# Patient Record
Sex: Female | Born: 1975 | Hispanic: No | Smoking: Current every day smoker
Health system: Southern US, Community
[De-identification: ages and names within clinical notes are randomized; demographics above are authoritative.]

## PROBLEM LIST (undated history)

## (undated) DIAGNOSIS — IMO0002 Reserved for concepts with insufficient information to code with codable children: Secondary | ICD-10-CM

## (undated) DIAGNOSIS — I8390 Asymptomatic varicose veins of unspecified lower extremity: Secondary | ICD-10-CM

## (undated) DIAGNOSIS — E079 Disorder of thyroid, unspecified: Secondary | ICD-10-CM

## (undated) DIAGNOSIS — C801 Malignant (primary) neoplasm, unspecified: Secondary | ICD-10-CM

## (undated) DIAGNOSIS — R87619 Unspecified abnormal cytological findings in specimens from cervix uteri: Secondary | ICD-10-CM

## (undated) DIAGNOSIS — G43909 Migraine, unspecified, not intractable, without status migrainosus: Secondary | ICD-10-CM

## (undated) HISTORY — DX: Asymptomatic varicose veins of unspecified lower extremity: I83.90

## (undated) HISTORY — DX: Migraine, unspecified, not intractable, without status migrainosus: G43.909

## (undated) HISTORY — DX: Reserved for concepts with insufficient information to code with codable children: IMO0002

## (undated) HISTORY — DX: Malignant (primary) neoplasm, unspecified: C80.1

## (undated) HISTORY — PX: COLPOSCOPY: SHX161

## (undated) HISTORY — DX: Unspecified abnormal cytological findings in specimens from cervix uteri: R87.619

## (undated) HISTORY — DX: Disorder of thyroid, unspecified: E07.9

---

## 2005-09-28 ENCOUNTER — Other Ambulatory Visit: Admission: RE | Admit: 2005-09-28 | Discharge: 2005-09-28 | Payer: Self-pay | Admitting: Obstetrics & Gynecology

## 2005-10-29 ENCOUNTER — Other Ambulatory Visit: Admission: RE | Admit: 2005-10-29 | Discharge: 2005-10-29 | Payer: Self-pay | Admitting: Obstetrics & Gynecology

## 2006-01-22 DIAGNOSIS — C801 Malignant (primary) neoplasm, unspecified: Secondary | ICD-10-CM

## 2006-01-22 HISTORY — DX: Malignant (primary) neoplasm, unspecified: C80.1

## 2006-01-22 HISTORY — PX: TOTAL THYROIDECTOMY: SHX2547

## 2006-04-17 ENCOUNTER — Other Ambulatory Visit: Admission: RE | Admit: 2006-04-17 | Discharge: 2006-04-17 | Payer: Self-pay | Admitting: Obstetrics & Gynecology

## 2006-10-03 ENCOUNTER — Other Ambulatory Visit: Admission: RE | Admit: 2006-10-03 | Discharge: 2006-10-03 | Payer: Self-pay | Admitting: Obstetrics & Gynecology

## 2007-03-26 ENCOUNTER — Other Ambulatory Visit: Admission: RE | Admit: 2007-03-26 | Discharge: 2007-03-26 | Payer: Self-pay | Admitting: Obstetrics & Gynecology

## 2007-10-08 ENCOUNTER — Other Ambulatory Visit: Admission: RE | Admit: 2007-10-08 | Discharge: 2007-10-08 | Payer: Self-pay | Admitting: Obstetrics and Gynecology

## 2011-02-22 DIAGNOSIS — C73 Malignant neoplasm of thyroid gland: Secondary | ICD-10-CM | POA: Insufficient documentation

## 2011-03-11 DIAGNOSIS — E890 Postprocedural hypothyroidism: Secondary | ICD-10-CM | POA: Insufficient documentation

## 2012-05-13 ENCOUNTER — Telehealth: Payer: Self-pay | Admitting: Certified Nurse Midwife

## 2012-05-13 NOTE — Telephone Encounter (Signed)
Pt received a letter from [facility] stating she needs her TSH levels tested. She was informed that she could have it done somewhere closer then the results faxed over. Can she have this blood test done here? If so will you put in an order and let me know when it is in so I can call her to schedule. Thanks.

## 2012-05-14 ENCOUNTER — Other Ambulatory Visit: Payer: Self-pay | Admitting: Certified Nurse Midwife

## 2012-05-14 DIAGNOSIS — E039 Hypothyroidism, unspecified: Secondary | ICD-10-CM

## 2012-05-14 NOTE — Telephone Encounter (Signed)
Need chart

## 2012-05-14 NOTE — Telephone Encounter (Signed)
You can let her schedule any time now order in

## 2012-05-21 ENCOUNTER — Telehealth: Payer: Self-pay | Admitting: Certified Nurse Midwife

## 2012-05-21 NOTE — Telephone Encounter (Signed)
Patient is reqeusting lab test for TSH be ordered and results to be faxed to her doctor at [facility], Dr. Celso Sickle. Please advise. sue

## 2012-05-21 NOTE — Telephone Encounter (Signed)
Patient is requesting blood work

## 2012-05-21 NOTE — Telephone Encounter (Signed)
Left message for patient to return call. sue 

## 2012-05-21 NOTE — Telephone Encounter (Signed)
Usually we do draw labs that we do not manage, the reason for drawing here?

## 2012-05-23 NOTE — Telephone Encounter (Signed)
Patient is requesting lab be done here B/C you are the only other provider she sees. Her doctor at [facility] in [facility], Dr. Celso Sickle, was reqeusting this be done. pateint did not want to go all the way to [facility] just for this. Please advise. sue

## 2012-05-23 NOTE — Telephone Encounter (Signed)
Left message as patient requested of D. Darcel Bayley response to lab being drawn here at our office. Verbal response from Ortencia Kick, CNM for patient to have labs drawn at her Dr. Lawerance Bach office or a [facility] lab here in [place].

## 2012-05-23 NOTE — Telephone Encounter (Signed)
Called CB# in EPIC left message  VM of need to return call. sue

## 2012-10-14 ENCOUNTER — Encounter: Payer: Self-pay | Admitting: Vascular Surgery

## 2012-10-15 ENCOUNTER — Encounter: Payer: Self-pay | Admitting: Vascular Surgery

## 2012-10-15 ENCOUNTER — Encounter (INDEPENDENT_AMBULATORY_CARE_PROVIDER_SITE_OTHER): Payer: BLUE CROSS/BLUE SHIELD | Admitting: *Deleted

## 2012-10-15 ENCOUNTER — Ambulatory Visit (INDEPENDENT_AMBULATORY_CARE_PROVIDER_SITE_OTHER): Payer: BLUE CROSS/BLUE SHIELD | Admitting: Vascular Surgery

## 2012-10-15 VITALS — BP 118/57 | HR 65 | Resp 18 | Ht 64.5 in | Wt 140.8 lb

## 2012-10-15 DIAGNOSIS — I83893 Varicose veins of bilateral lower extremities with other complications: Secondary | ICD-10-CM

## 2012-10-15 NOTE — Progress Notes (Signed)
[facility] [place]  Patient name: Amy Bonilla MRN: [id number] DOB: [birth year] Sex: female  REASON FOR CONSULT: varicose veins of the right lower extremity.  HPI: Amy Bonilla is a 37 y.o. female who presents with some varicose veins of her right lower extremity. She states that she has had these for 3-5 years. She experiences some tightness in her veins which she's been standing and also some aching and heaviness in her legs with standing. Her symptoms are relieved somewhat with elevation. She is unaware of any previous history of DVT or phlebitis. She has not worn compression stockings before. She has not had any bleeding problems associated with her varicosities.  Past Medical History  Diagnosis Date  . Varicose veins   . Cancer 2008    thyroid   History reviewed. No pertinent family history.  SOCIAL HISTORY: History  Substance Use Topics  . Smoking status: Current Every Day Smoker -- 0.50 packs/day    Types: Cigarettes  . Smokeless tobacco: Not on file  . Alcohol Use: Yes     Comment: 1-3 glasses of wine daily   No Known Allergies  Current Outpatient Prescriptions  Medication Sig Dispense Refill  . levothyroxine (SYNTHROID, LEVOTHROID) 75 MCG tablet Take 75 mcg by mouth daily before breakfast.       No current facility-administered medications for this visit.   REVIEW OF SYSTEMS: Arly.Keller ] denotes positive finding; [  ] denotes negative finding  CARDIOVASCULAR:  [ ]  chest pain   [ ]  chest pressure   [ ]  palpitations   [ ]  orthopnea   [ ]  dyspnea on exertion   [ ]  claudication   [ ]  rest pain   [ ]  DVT   [ ]  phlebitis PULMONARY:   [ ]  productive cough   [ ]  asthma   [ ]  wheezing NEUROLOGIC:   [ ]  weakness  [ ]  paresthesias  [ ]  aphasia  [ ]  amaurosis  [ ]  dizziness HEMATOLOGIC:   [ ]  bleeding problems   [ ]  clotting disorders MUSCULOSKELETAL:  [ ]  joint pain   [ ]  joint swelling [ ]  leg swelling GASTROINTESTINAL: [ ]   blood in stool  [ ]    hematemesis GENITOURINARY:  [ ]   dysuria  [ ]   hematuria PSYCHIATRIC:  [ ]  history of major depression INTEGUMENTARY:  [ ]  rashes  [ ]  ulcers CONSTITUTIONAL:  [ ]  fever   [ ]  chills  PHYSICAL EXAM: Filed Vitals:   10/15/12 1214  BP: 118/57  Pulse: 65  Resp: 18  Height: 5' 4.5" (1.638 m)  Weight: 140 lb 12.8 oz (63.866 kg)   Body mass index is 23.8 kg/(m^2). GENERAL: The patient is a well-nourished female, in no acute distress. The vital signs are documented above. CARDIOVASCULAR: There is a regular rate and rhythm. Do not detect carotid bruits. She has palpable pedal pulses. He has no significant lower extremity swelling. PULMONARY: There is good air exchange bilaterally without wheezing or rales. ABDOMEN: Soft and non-tender with normal pitched bowel sounds.  MUSCULOSKELETAL: There are no major deformities or cyanosis. NEUROLOGIC: No focal weakness or paresthesias are detected. SKIN: she has a reticular vein which extends along the posterior lateral aspect of her right thigh across her popliteal fossa down into her right leg. There are no large truncal varicosities. PSYCHIATRIC: The patient has a normal affect.  DATA:  I have independently interpreted her venous duplex scan. She has deep vein reflux involving the common femoral vein,  femoral vein, popliteal vein, and saphenofemoral junction. There is no incompetence of the right greater saphenous vein or small saphenous vein.  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 There is no incompetence of the lesser saphenous vein on the right. We have discussed the importance of intermittent leg elevation and compression therapy. 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 I've encouraged her to elevate her legs when she gets home at night. Addition I have written her a prescription for knee-high compression stockings with a gradient of 15-20 mm of  mercury. She wanted to consider sclerotherapy I think she might be a candidate for sclerotherapy of this reticular vein. She will think about this and get back to [place]. I think the vein is probably too small for segmental excision. She'll let us know she elects to proceed with sclerotherapy.  DICKSON,CHRISTOPHER S [facility]: [phone]

## 2012-10-20 ENCOUNTER — Other Ambulatory Visit: Payer: Self-pay | Admitting: *Deleted

## 2012-10-20 DIAGNOSIS — I83893 Varicose veins of bilateral lower extremities with other complications: Secondary | ICD-10-CM

## 2012-12-05 ENCOUNTER — Encounter: Payer: Self-pay | Admitting: Certified Nurse Midwife

## 2012-12-08 ENCOUNTER — Encounter: Payer: Self-pay | Admitting: Certified Nurse Midwife

## 2012-12-08 ENCOUNTER — Ambulatory Visit (INDEPENDENT_AMBULATORY_CARE_PROVIDER_SITE_OTHER): Payer: BLUE CROSS/BLUE SHIELD | Admitting: Certified Nurse Midwife

## 2012-12-08 VITALS — BP 110/64 | HR 68 | Resp 16 | Ht 65.25 in | Wt 141.0 lb

## 2012-12-08 DIAGNOSIS — Z01419 Encounter for gynecological examination (general) (routine) without abnormal findings: Secondary | ICD-10-CM

## 2012-12-08 DIAGNOSIS — Z309 Encounter for contraceptive management, unspecified: Secondary | ICD-10-CM

## 2012-12-08 MED ORDER — NORETHINDRONE 0.35 MG PO TABS: 1.0000 | ORAL_TABLET | Freq: Every day | ORAL | Status: DC

## 2012-12-08 NOTE — Progress Notes (Signed)
37 y.o. G0P0000 Single Caucasian Fe here for annual exam. Periods normal, no issues. No STD concerns, no partner change, now fiancee. Sees PCP for aex, labs and hypothyroid management. Training for half marathon!    Patient's last menstrual period was 11/10/2012.          Sexually active: no  The current method of family planning is abstinence.    Exercising: yes  running Smoker:  yes  Health Maintenance: Pap:  12-06-11 neg HPV HR neg MMG:  none Colonoscopy:  none BMD:   none TDaP: unsure per patient Labs: none Self breast exam: done occ   reports that she has been smoking Cigarettes.  She has been smoking about 0.50 packs per day. She does not have any smokeless tobacco history on file. She reports that she drinks alcohol. She reports that she does not use illicit drugs.  Past Medical History  Diagnosis Date  . Varicose veins   . Thyroid disease     hypothyroid  . Cancer 2008    thyroid   . Migraines   . Abnormal Pap smear     2007 LGSIL ,2008 ASCUS +HPV    Past Surgical History  Procedure Laterality Date  . Total thyroidectomy  2008  . Colposcopy  10/16/2005    bx neg, ecc neg    Current Outpatient Prescriptions  Medication Sig Dispense Refill  . aspirin-acetaminophen-caffeine (EXCEDRIN MIGRAINE) 250-250-65 MG per tablet Take by mouth every 6 (six) hours as needed for headache.      . levothyroxine (SYNTHROID, LEVOTHROID) 75 MCG tablet Take 75 mcg by mouth daily before breakfast.      . norethindrone (MICRONOR,CAMILA,ERRIN) 0.35 MG tablet Take 1 tablet by mouth daily.       No current facility-administered medications for this visit.    No family history on file.  ROS:  Pertinent items are noted in HPI.  Otherwise, a comprehensive ROS was negative.  Exam:   BP 110/64  Pulse 68  Resp 16  Ht 5' 5.25" (1.657 m)  Wt 141 lb (63.957 kg)  BMI 23.29 kg/m2  LMP 11/10/2012 Height: 5' 5.25" (165.7 cm)  Ht Readings from Last 3 Encounters:  12/08/12 5' 5.25" (1.657 m)   10/15/12 5' 4.5" (1.638 m)    General appearance: alert, cooperative and appears stated age Head: Normocephalic, without obvious abnormality, atraumatic Neck: no adenopathy, supple, symmetrical, trachea midline and thyroid normal to inspection and palpation Lungs: clear to auscultation bilaterally Breasts: normal appearance, no masses or tenderness, No nipple retraction or dimpling, No nipple discharge or bleeding, No axillary or supraclavicular adenopathy Heart: regular rate and rhythm Abdomen: soft, non-tender; no masses,  no organomegaly Extremities: extremities normal, atraumatic, no cyanosis or edema Skin: Skin color, texture, turgor normal. No rashes or lesions Lymph nodes: Cervical, supraclavicular, and axillary nodes normal. No abnormal inguinal nodes palpated Neurologic: Grossly normal   Pelvic: External genitalia:  no lesions              Urethra:  normal appearing urethra with no masses, tenderness or lesions              Bartholin's and Skene's: normal                 Vagina: normal appearing vagina with normal color and discharge, no lesions              Cervix: normal, non tender              Pap taken: no  Bimanual Exam:  Uterus:  normal size, contour, position, consistency, mobility, non-tender and retroverted              Adnexa: normal adnexa and no mass, fullness, tenderness               Rectovaginal: Confirms               Anus:  normal sphincter tone, no lesions  A:  Well Woman with normal exam  Contraception when sexually active Camilla, would refill  Hypothyroid on stable medication  P:   Reviewed health and wellness pertinent to exam  Rx Camilla see order  Continue follow up as indicated  Pap smear as per guidelines   pap smear not taken today  counseled on breast self exam, adequate intake of calcium and vitamin D, diet and exercise return annually or prn  An After Visit Summary was printed and given to the patient.

## 2012-12-08 NOTE — Patient Instructions (Signed)
General topics  Next pap or exam is  due in 1 year Take a Women's multivitamin Take 1200 mg. of calcium daily - prefer dietary If any concerns in interim to call back  Breast Self-Awareness Practicing breast self-awareness may pick up problems early, prevent significant medical complications, and possibly save your life. By practicing breast self-awareness, you can become familiar with how your breasts look and feel and if your breasts are changing. This allows you to notice changes early. It can also offer you some reassurance that your breast health is good. One way to learn what is normal for your breasts and whether your breasts are changing is to do a breast self-exam. If you find a lump or something that was not present in the past, it is best to contact your caregiver right away. Other findings that should be evaluated by your caregiver include nipple discharge, especially if it is bloody; skin changes or reddening; areas where the skin seems to be pulled in (retracted); or new lumps and bumps. Breast pain is seldom associated with cancer (malignancy), but should also be evaluated by a caregiver. BREAST SELF-EXAM The best time to examine your breasts is 5 7 days after your menstrual period is over.  ExitCare Patient Information 2013 [employer].   Exercise to Stay Healthy Exercise helps you become and stay healthy. EXERCISE IDEAS AND TIPS Choose exercises that:  You enjoy.  Fit into your day. You do not need to exercise really hard to be healthy. You can do exercises at a slow or medium level and stay healthy. You can:  Stretch before and after working out.  Try yoga, Pilates, or tai chi.  Lift weights.  Walk fast, swim, jog, run, climb stairs, bicycle, dance, or rollerskate.  Take aerobic classes. Exercises that burn about 150 calories:  Running 1  miles in 15 minutes.  Playing volleyball for 45 to 60 minutes.  Washing and waxing a car for 45 to 60  minutes.  Playing touch football for 45 minutes.  Walking 1  miles in 35 minutes.  Pushing a stroller 1  miles in 30 minutes.  Playing basketball for 30 minutes.  Raking leaves for 30 minutes.  Bicycling 5 miles in 30 minutes.  Walking 2 miles in 30 minutes.  Dancing for 30 minutes.  Shoveling snow for 15 minutes.  Swimming laps for 20 minutes.  Walking up stairs for 15 minutes.  Bicycling 4 miles in 15 minutes.  Gardening for 30 to 45 minutes.  Jumping rope for 15 minutes.  Washing windows or floors for 45 to 60 minutes. Document Released: 02/10/2010 Document Revised: 04/02/2011 Document Reviewed: 02/10/2010 ExitCare Patient Information 2013 [employer].   Other topics ( that may be useful information):    Sexually Transmitted Disease Sexually transmitted disease (STD) refers to any infection that is passed from person to person during sexual activity. This may happen by way of saliva, semen, blood, vaginal mucus, or urine. Common STDs include:  Gonorrhea.  Chlamydia.  Syphilis.  HIV/AIDS.  Genital herpes.  Hepatitis B and C.  Trichomonas.  Human papillomavirus (HPV).  Pubic lice. CAUSES  An STD may be spread by bacteria, virus, or parasite. A person can get an STD by:  Sexual intercourse with an infected person.  Sharing sex toys with an infected person.  Sharing needles with an infected person.  Having intimate contact with the genitals, mouth, or rectal areas of an infected person. SYMPTOMS  Some people may not have any symptoms, but   they can still pass the infection to others. Different STDs have different symptoms. Symptoms include:  Painful or bloody urination.  Pain in the pelvis, abdomen, vagina, anus, throat, or eyes.  Skin rash, itching, irritation, growths, or sores (lesions). These usually occur in the genital or anal area.  Abnormal vaginal discharge.  Penile discharge in men.  Soft, flesh-colored skin growths in the  genital or anal area.  Fever.  Pain or bleeding during sexual intercourse.  Swollen glands in the groin area.  Yellow skin and eyes (jaundice). This is seen with hepatitis. DIAGNOSIS  To make a diagnosis, your caregiver may:  Take a medical history.  Perform a physical exam.  Take a specimen (culture) to be examined.  Examine a sample of discharge under a microscope.  Perform blood test TREATMENT   Chlamydia, gonorrhea, trichomonas, and syphilis can be cured with antibiotic medicine.  Genital herpes, hepatitis, and HIV can be treated, but not cured, with prescribed medicines. The medicines will lessen the symptoms.  Genital warts from HPV can be treated with medicine or by freezing, burning (electrocautery), or surgery. Warts may come back.  HPV is a virus and cannot be cured with medicine or surgery.However, abnormal areas may be followed very closely by your caregiver and may be removed from the cervix, vagina, or vulva through office procedures or surgery. If your diagnosis is confirmed, your recent sexual partners need treatment. This is true even if they are symptom-free or have a negative culture or evaluation. They should not have sex until their caregiver says it is okay. HOME CARE INSTRUCTIONS  All sexual partners should be informed, tested, and treated for all STDs.  Take your antibiotics as directed. Finish them even if you start to feel better.  Only take over-the-counter or prescription medicines for pain, discomfort, or fever as directed by your caregiver.  Rest.  Eat a balanced diet and drink enough fluids to keep your urine clear or pale yellow.  Do not have sex until treatment is completed and you have followed up with your caregiver. STDs should be checked after treatment.  Keep all follow-up appointments, Pap tests, and blood tests as directed by your caregiver.  Only use latex condoms and water-soluble lubricants during sexual activity. Do not use  petroleum jelly or oils.  Avoid alcohol and illegal drugs.  Get vaccinated for HPV and hepatitis. If you have not received these vaccines in the past, talk to your caregiver about whether one or both might be right for you.  Avoid risky sex practices that can break the skin. The only way to avoid getting an STD is to avoid all sexual activity.Latex condoms and dental dams (for oral sex) will help lessen the risk of getting an STD, but will not completely eliminate the risk. SEEK MEDICAL CARE IF:   You have a fever.  You have any new or worsening symptoms. Document Released: 03/31/2002 Document Revised: 04/02/2011 Document Reviewed: 04/07/2010 ExitCare Patient Information 2013 [employer]Care[employer].    Domestic Abuse You are being battered or abused if someone close to you hits, pushes, or physically hurts you in any way. You also are being abused if you are forced into activities. You are being sexually abused if you are forced to have sexual contact of any kind. You are being emotionally abused if you are made to feel worthless or if you are constantly threatened. It is important to remember that help is available. No one has the right to abuse you. PREVENTION OF FURTHER   ABUSE  Learn the warning signs of danger. This varies with situations but may include: the use of alcohol, threats, isolation from friends and family, or forced sexual contact. Leave if you feel that violence is going to occur.  If you are attacked or beaten, report it to the police so the abuse is documented. You do not have to press charges. The police can protect you while you or the attackers are leaving. Get the officer's name and badge number and a copy of the report.  Find someone you can trust and tell them what is happening to you: your caregiver, a nurse, clergy member, close friend or family member. Feeling ashamed is natural, but remember that you have done nothing wrong. No one deserves abuse. Document Released:  01/06/2000 Document Revised: 04/02/2011 Document Reviewed: 03/16/2010 ExitCare Patient Information 2013 [employer]Care[employer].    How Much is Too Much Alcohol? Drinking too much alcohol can cause injury, accidents, and health problems. These types of problems can include:   Car crashes.  Falls.  Family fighting (domestic violence).  Drowning.  Fights.  Injuries.  Burns.  Damage to certain organs.  Having a baby with birth defects. ONE DRINK CAN BE TOO MUCH WHEN YOU ARE:  Working.  Pregnant or breastfeeding.  Taking medicines. Ask your doctor.  Driving or planning to drive. If you or someone you know has a drinking problem, get help from a doctor.  Document Released: 11/04/2008 Document Revised: 04/02/2011 Document Reviewed: 11/04/2008 ExitCare Patient Information 2013 [employer].   Smoking Hazards Smoking cigarettes is extremely bad for your health. Tobacco smoke has over 200 known poisons in it. There are over 60 chemicals in tobacco smoke that cause cancer. Some of the chemicals found in cigarette smoke include:   Cyanide.  Benzene.  Formaldehyde.  Methanol (wood alcohol).  Acetylene (fuel used in welding torches).  Ammonia. Cigarette smoke also contains the poisonous gases nitrogen oxide and carbon monoxide.  Cigarette smokers have an increased risk of many serious medical problems and Smoking causes approximately:  90% of all lung cancer deaths in men.  80% of all lung cancer deaths in women.  90% of deaths from chronic obstructive lung disease. Compared with nonsmokers, smoking increases the risk of:  Coronary heart disease by 2 to 4 times.  Stroke by 2 to 4 times.  Men developing lung cancer by 23 times.  Women developing lung cancer by 13 times.  Dying from chronic obstructive lung diseases by 12 times.  . Smoking is the most preventable cause of death and disease in our society.  WHY IS SMOKING ADDICTIVE?  Nicotine is the chemical  agent in tobacco that is capable of causing addiction or dependence.  When you smoke and inhale, nicotine is absorbed rapidly into the bloodstream through your lungs. Nicotine absorbed through the lungs is capable of creating a powerful addiction. Both inhaled and non-inhaled nicotine may be addictive.  Addiction studies of cigarettes and spit tobacco show that addiction to nicotine occurs mainly during the teen years, when young people begin using tobacco products. WHAT ARE THE BENEFITS OF QUITTING?  There are many health benefits to quitting smoking.   Likelihood of developing cancer and heart disease decreases. Health improvements are seen almost immediately.  Blood pressure, pulse rate, and breathing patterns start returning to normal soon after quitting. QUITTING SMOKING   American Lung Association - 1-800-LUNGUSA  American Cancer Society - 1-800-ACS-2345 Document Released: 02/16/2004 Document Revised: 04/02/2011 Document Reviewed: 10/20/2008 ExitCare Patient Information 2013 ExitCare,   LLC.   Stress Management Stress is a state of physical or mental tension that often results from changes in your life or normal routine. Some common causes of stress are:  Death of a loved one.  Injuries or severe illnesses.  Getting fired or changing jobs.  Moving into a new home. Other causes may be:  Sexual problems.  Business or financial losses.  Taking on a large debt.  Regular conflict with someone at home or at work.  Constant tiredness from lack of sleep. It is not just bad things that are stressful. It may be stressful to:  Win the lottery.  Get married.  Buy a new car. The amount of stress that can be easily tolerated varies from person to person. Changes generally cause stress, regardless of the types of change. Too much stress can affect your health. It may lead to physical or emotional problems. Too little stress (boredom) may also become stressful. SUGGESTIONS TO  REDUCE STRESS:  Talk things over with your family and friends. It often is helpful to share your concerns and worries. If you feel your problem is serious, you may want to get help from a professional counselor.  Consider your problems one at a time instead of lumping them all together. Trying to take care of everything at once may seem impossible. List all the things you need to do and then start with the most important one. Set a goal to accomplish 2 or 3 things each day. If you expect to do too many in a single day you will naturally fail, causing you to feel even more stressed.  Do not use alcohol or drugs to relieve stress. Although you may feel better for a short time, they do not remove the problems that caused the stress. They can also be habit forming.  Exercise regularly - at least 3 times per week. Physical exercise can help to relieve that "uptight" feeling and will relax you.  The shortest distance between despair and hope is often a good night's sleep.  Go to bed and get up on time allowing yourself time for appointments without being rushed.  Take a short "time-out" period from any stressful situation that occurs during the day. Close your eyes and take some deep breaths. Starting with the muscles in your face, tense them, hold it for a few seconds, then relax. Repeat this with the muscles in your neck, shoulders, hand, stomach, back and legs.  Take good care of yourself. Eat a balanced diet and get plenty of rest.  Schedule time for having fun. Take a break from your daily routine to relax. HOME CARE INSTRUCTIONS   Call if you feel overwhelmed by your problems and feel you can no longer manage them on your own.  Return immediately if you feel like hurting yourself or someone else. Document Released: 07/04/2000 Document Revised: 04/02/2011 Document Reviewed: 02/24/2007 ExitCare Patient Information 2013 [employer].   

## 2012-12-09 NOTE — Progress Notes (Signed)
Note reviewed, agree with plan.  Rannie Craney, MD  

## 2013-12-10 ENCOUNTER — Ambulatory Visit (INDEPENDENT_AMBULATORY_CARE_PROVIDER_SITE_OTHER): Payer: BLUE CROSS/BLUE SHIELD | Admitting: Certified Nurse Midwife

## 2013-12-10 ENCOUNTER — Encounter: Payer: Self-pay | Admitting: Certified Nurse Midwife

## 2013-12-10 ENCOUNTER — Other Ambulatory Visit: Payer: Self-pay | Admitting: Certified Nurse Midwife

## 2013-12-10 VITALS — BP 110/64 | HR 68 | Resp 16 | Ht 64.75 in | Wt 144.0 lb

## 2013-12-10 DIAGNOSIS — Z124 Encounter for screening for malignant neoplasm of cervix: Secondary | ICD-10-CM

## 2013-12-10 DIAGNOSIS — Z0000 Encounter for general adult medical examination without abnormal findings: Secondary | ICD-10-CM

## 2013-12-10 DIAGNOSIS — Z3041 Encounter for surveillance of contraceptive pills: Secondary | ICD-10-CM

## 2013-12-10 DIAGNOSIS — Z01419 Encounter for gynecological examination (general) (routine) without abnormal findings: Secondary | ICD-10-CM

## 2013-12-10 DIAGNOSIS — Z23 Encounter for immunization: Secondary | ICD-10-CM

## 2013-12-10 MED ORDER — NORETHINDRONE 0.35 MG PO TABS: 1.0000 | ORAL_TABLET | Freq: Every day | ORAL | Status: DC

## 2013-12-10 NOTE — Patient Instructions (Signed)

## 2013-12-10 NOTE — Progress Notes (Signed)
38 y.o. G0P0000 Single Caucasian Fe here for annual exam. Periods normal, no issues. Contraception working well. Engaged to be married , but has been called off, working through it now no issues. Recovering from URI using OTC medication with good result. Hypothyroid medication stable with Endocrine managaement. No STD concerns but would like Gc,Chlamydia screen. Still Smoking but has decreased over the past year. Running in another 5k this week end. No other health issues today. . Patient's last menstrual period was 11/26/2013.          Sexually active: No.  The current method of family planning is oral progesterone-only contraceptive.    Exercising: Yes.    running Smoker:  yes  Health Maintenance: Pap:  12-06-11 neg HPV HR neg MMG:  none Colonoscopy:  none BMD:   none TDaP:  unsure Labs: none Self breast exam: done occ   reports that she has been smoking Cigarettes.  She has been smoking about 0.50 packs per day. She does not have any smokeless tobacco history on file. She reports that she drinks alcohol. She reports that she does not use illicit drugs.  Past Medical History  Diagnosis Date  . Varicose veins   . Thyroid disease     hypothyroid  . Cancer 2008    thyroid   . Migraines   . Abnormal Pap smear     2007 LGSIL ,2008 ASCUS +HPV    Past Surgical History  Procedure Laterality Date  . Total thyroidectomy  2008  . Colposcopy  10/16/2005    bx neg, ecc neg    Current Outpatient Prescriptions  Medication Sig Dispense Refill  . aspirin-acetaminophen-caffeine (EXCEDRIN MIGRAINE) 250-250-65 MG per tablet Take by mouth every 6 (six) hours as needed for headache.    . levothyroxine (SYNTHROID, LEVOTHROID) 175 MCG tablet daily.  11  . norethindrone (MICRONOR,CAMILA,ERRIN) 0.35 MG tablet Take 1 tablet (0.35 mg total) by mouth daily. 1 Package 12   No current facility-administered medications for this visit.    History reviewed. No pertinent family history.  ROS:   Pertinent items are noted in HPI.  Otherwise, a comprehensive ROS was negative.  Exam:   BP 110/64 mmHg  Pulse 68  Resp 16  Ht 5' 4.75" (1.645 m)  Wt 144 lb (65.318 kg)  BMI 24.14 kg/m2  LMP 11/26/2013 Height: 5' 4.75" (164.5 cm)  Ht Readings from Last 3 Encounters:  12/10/13 5' 4.75" (1.645 m)  12/08/12 5' 5.25" (1.657 m)  10/15/12 5' 4.5" (1.638 m)    General appearance: alert, cooperative and appears stated age Head: Normocephalic, without obvious abnormality, atraumatic Neck: no adenopathy, supple, symmetrical, trachea midline and thyroid normal to inspection and palpation mildly enlarged Lungs: clear to auscultation bilaterally Breasts: normal appearance, no masses or tenderness, No nipple retraction or dimpling, No nipple discharge or bleeding, No axillary or supraclavicular adenopathy Heart: regular rate and rhythm Abdomen: soft, non-tender; no masses,  no organomegaly Extremities: extremities normal, atraumatic, no cyanosis or edema Skin: Skin color, texture, turgor normal. No rashes or lesions Lymph nodes: Cervical, supraclavicular, and axillary nodes normal. No abnormal inguinal nodes palpated Neurologic: Grossly normal   Pelvic: External genitalia:  no lesions              Urethra:  normal appearing urethra with no masses, tenderness or lesions              Bartholin's and Skene's: normal  Vagina: normal appearing vagina with normal color and discharge, no lesions              Cervix: normal appearance, non tender, no lesions              Pap taken: Yes.   Bimanual Exam:  Uterus:  normal size, contour, position, consistency, mobility, non-tender and anteverted              Adnexa: normal adnexa and no mass, fullness, tenderness               Rectovaginal: Confirms               Anus:  normal sphincter tone, no lesions  A:  Well Woman with normal exam  Contraception POP desired  STD screening  Immunization update  Smoker  Hypothyroid on stable  medication with Endocrine management  P:   Reviewed health and wellness pertinent to exam  Rx Micronor see order  Lab Gc,Chlamydia  Requests TDAP  Discussed smoking cessation, she is working on herself, declines assistance  Continue follow up as indicated  Pap smear taken today with HPV reflex   counseled on breast self exam, mammography screening at 40, STD prevention, HIV risk factors and prevention, adequate intake of calcium and vitamin D, diet and exercise return annually or prn  An After Visit Summary was printed and given to the patient.

## 2013-12-11 LAB — IPS PAP TEST WITH REFLEX TO HPV

## 2013-12-11 NOTE — Progress Notes (Signed)
Reviewed personally.  M. Suzanne Alejandrina Raimer, MD.  

## 2013-12-12 LAB — IPS N GONORRHOEA AND CHLAMYDIA BY PCR

## 2013-12-14 ENCOUNTER — Other Ambulatory Visit: Payer: Self-pay | Admitting: Certified Nurse Midwife

## 2013-12-14 NOTE — Addendum Note (Signed)
Addended by: Regina Eck on: 12/14/2013 08:37 AM   Modules accepted: Orders, SmartSet

## 2013-12-15 ENCOUNTER — Telehealth: Payer: Self-pay

## 2013-12-15 LAB — IPS HPV ON A LIQUID BASED SPECIMEN

## 2013-12-15 NOTE — Telephone Encounter (Signed)
-----   Message from Regina Eck, CNM sent at 12/14/2013  9:23 PM EST ----- Notify that GC, Chlamydia negative Pap smear pending for [facility]

## 2013-12-15 NOTE — Telephone Encounter (Signed)
Return call to Joy. °

## 2013-12-15 NOTE — Telephone Encounter (Signed)
Patient notified of results as written by provider 

## 2013-12-15 NOTE — Telephone Encounter (Signed)
Mailbox full. Try again. 

## 2013-12-22 ENCOUNTER — Other Ambulatory Visit: Payer: Self-pay | Admitting: Certified Nurse Midwife

## 2013-12-22 DIAGNOSIS — R87619 Unspecified abnormal cytological findings in specimens from cervix uteri: Secondary | ICD-10-CM

## 2013-12-23 ENCOUNTER — Telehealth: Payer: Self-pay | Admitting: Emergency Medicine

## 2013-12-23 NOTE — Telephone Encounter (Signed)
-----   Message from Regina Eck, CNM sent at 12/22/2013  7:59 AM EST ----- Notify patient that pap was normal, but HPVHR detected, needs colpo for evaluation order in

## 2013-12-24 NOTE — Telephone Encounter (Signed)
Patient notified of message from Regina Eck CNM.   She is agreeable to scheduling colposcopy. Brief description of procedure given to patient.  Colposcopy pre-procedure instructions given. Advised 800 mg of Motrin with food one hour prior to appointment. Motrin=Advil=Ibuprofen Can take 800 mg (Can purchase over the counter, you will need four 200 mg pills). Make sure to eat a meal before appointment and drink plenty of fluids. Patient verbalized understanding and will call to reschedule if will be on menses or has any spotting. Advised will need to cancel within 24 hours or will have $100.00 no show fee placed to account.  Type of birth control LMP-Patient unsure, does not write down cycles but feels she should have a cycle very soon. Patient states she has not been sexually active since July 2015. Copayment: $5.00-Patient notified. Notified of cancellation policy. Scheduled procedure room.  Linked order to procedure.   Routing to provider for final review. Patient agreeable to disposition. Will close encounter

## 2014-01-04 ENCOUNTER — Encounter: Payer: Self-pay | Admitting: Certified Nurse Midwife

## 2014-01-04 ENCOUNTER — Ambulatory Visit (INDEPENDENT_AMBULATORY_CARE_PROVIDER_SITE_OTHER): Payer: BLUE CROSS/BLUE SHIELD | Admitting: Certified Nurse Midwife

## 2014-01-04 VITALS — BP 108/62 | HR 68 | Resp 16 | Ht 64.75 in | Wt 148.0 lb

## 2014-01-04 DIAGNOSIS — R8789 Other abnormal findings in specimens from female genital organs: Secondary | ICD-10-CM

## 2014-01-04 DIAGNOSIS — B977 Papillomavirus as the cause of diseases classified elsewhere: Secondary | ICD-10-CM

## 2014-01-04 DIAGNOSIS — IMO0002 Reserved for concepts with insufficient information to code with codable children: Secondary | ICD-10-CM

## 2014-01-04 DIAGNOSIS — R87619 Unspecified abnormal cytological findings in specimens from cervix uteri: Secondary | ICD-10-CM

## 2014-01-04 NOTE — Patient Instructions (Signed)

## 2014-01-04 NOTE — Progress Notes (Addendum)
Patient ID: REVIA Bonilla, female   DOB: Mar 05, 1975, 38 y.o.   MRN: [id number]  Chief Complaint  Patient presents with  . Colposcopy    HPI Amy Bonilla is a 38 y.o. single white female g0 p0  her for colposcopy. Denies vaginal bleeding or pelvic pain.  HPI  Indications: Pap smear on 11/19 2015 showed: +HPVHR. Previous colposcopy: 2007 LSIL, 2008 ASCUS + HPVHR Prior cervical treatment: expectant management with pap smears..  Past Medical History  Diagnosis Date  . Varicose veins   . Thyroid disease     hypothyroid  . Cancer 2008    thyroid   . Migraines   . Abnormal Pap smear     2007 LGSIL ,2008 ASCUS +HPV, 12-10-13 neg HPV HR+    Past Surgical History  Procedure Laterality Date  . Total thyroidectomy  2008  . Colposcopy  10/16/2005    bx neg, ecc neg  . Colposcopy      10-16-05 bx & ecc neg    History reviewed. No pertinent family history.  Social History History  Substance Use Topics  . Smoking status: Current Every Day Smoker -- 0.50 packs/day    Types: Cigarettes  . Smokeless tobacco: Not on file  . Alcohol Use: 0.0 oz/week    0 Not specified per week     Comment: 2 a day wine    No Known Allergies  Current Outpatient Prescriptions  Medication Sig Dispense Refill  . aspirin-acetaminophen-caffeine (EXCEDRIN MIGRAINE) 250-250-65 MG per tablet Take by mouth every 6 (six) hours as needed for headache.    . levothyroxine (SYNTHROID, LEVOTHROID) 175 MCG tablet daily.  11  . norethindrone (MICRONOR,CAMILA,ERRIN) 0.35 MG tablet Take 1 tablet (0.35 mg total) by mouth daily. 1 Package 12   No current facility-administered medications for this visit.    Review of Systems Review of Systems  Genitourinary: Negative for vaginal bleeding, vaginal discharge and vaginal pain.    Blood pressure 108/62, pulse 68, resp. rate 16, height 5' 4.75" (1.645 m), weight 148 lb (67.132 kg), last menstrual period 01/01/2014.  Physical Exam Physical Exam  Constitutional:  She is oriented to person, place, and time. She appears well-developed and well-nourished.  Genitourinary: Vagina normal.    Neurological: She is alert and oriented to person, place, and time.  Skin: Skin is warm and dry.  Psychiatric: She has a normal mood and affect. Her behavior is normal. Judgment and thought content normal.    Data Reviewed Pap smear with questions addressed   Assessment   +HPVHR here for colposcopy Procedure Details  The risks and benefits of the procedure and Written informed consent obtained.  Speculum placed in vagina and excellent visualization of cervix achieved, cervix swabbed x 3 with saline and acetic acid solution. Cervix visualized with 3.75, 7.5, 15 # and green filter, no acetowhite areas noted. Lugol's applied with all areas staining. ECC taken with difficulty due to nullparous os. Patient tolerated procedure well. No bleeding noted on speculum removal. Instructions given.  Specimens: 1  Complications: none.     Plan    Specimens labelled and sent to Pathology. Patient will be called with results when reviewed.    Pathology of ECC showed no dyplasia or malignancy, patient will be notified of results and follow up pap in one year.  Pap recall .   LEONARD,DEBORAH 01/04/2014, 2:47 PM

## 2014-01-04 NOTE — Progress Notes (Signed)
Pt has previous abnormal pap history LGSIL 12-10-13 neg pap HPV HR + Pt took 750mg  ibuprofen at 1:15pm

## 2014-01-06 NOTE — Progress Notes (Signed)
Reviewed personally.  M. Suzanne Woodward Klem, MD.  

## 2014-01-08 LAB — IPS OTHER TISSUE BIOPSY

## 2014-01-11 ENCOUNTER — Telehealth: Payer: Self-pay

## 2014-01-11 NOTE — Telephone Encounter (Signed)
Attempted to reach patient at number provided [phone]. There was no answer and recording states the voicemail box is currently full and is not accepting voicemails at this time. Will try to reach patient again later. 08 recall entered.

## 2014-01-11 NOTE — Telephone Encounter (Signed)
-----   Message from Regina Eck, CNM sent at 01/08/2014  1:41 PM EST ----- Notify patient that ECC from colposcopy showed no dysplasia or malignancy  Repeat pap in one year Pap recall 08

## 2014-01-11 NOTE — Telephone Encounter (Signed)
Spoke with patient and message from Regina Eck CNM given. Patient verbalized understanding and agreeable to follow up as scheduled for 11/2014. Routing to provider for final review. Patient agreeable to disposition. Will close encounter  08 recall in placed.  Routing to provider for final review. Patient agreeable to disposition. Will close encounter

## 2014-01-21 NOTE — Addendum Note (Signed)
Addended by: Graylon Good on: 01/21/2014 08:54 AM   Modules accepted: Orders, SmartSet

## 2014-08-18 ENCOUNTER — Other Ambulatory Visit: Payer: Self-pay | Admitting: Certified Nurse Midwife

## 2014-08-18 NOTE — Telephone Encounter (Signed)
Medication refill request: Amy Bonilla 0.35 Last AEX:  12/10/13 with DL Next AEX: 12/22/14 with DL Last MMG (if hormonal medication request): none Refill authorized: Please advise.

## 2014-11-30 ENCOUNTER — Other Ambulatory Visit: Payer: Self-pay | Admitting: Certified Nurse Midwife

## 2014-11-30 NOTE — Telephone Encounter (Signed)
Medication refill request: Ortho micronor  Last AEX:  12-10-13  Next AEX: 12-22-14  Last MMG (if hormonal medication request): None Refill authorized: please advise

## 2014-12-20 ENCOUNTER — Telehealth: Payer: Self-pay | Admitting: Certified Nurse Midwife

## 2014-12-20 NOTE — Telephone Encounter (Signed)
LMTCB about canceled appointment °

## 2014-12-22 ENCOUNTER — Ambulatory Visit: Payer: BLUE CROSS/BLUE SHIELD | Admitting: Certified Nurse Midwife

## 2015-01-02 ENCOUNTER — Other Ambulatory Visit: Payer: Self-pay | Admitting: Certified Nurse Midwife

## 2015-01-03 NOTE — Telephone Encounter (Signed)
Medication refill request: ortho micronor Last AEX:  12-10-13 Next AEX: 01-13-15 Last MMG (if hormonal medication request): none Refill authorized: please advise

## 2015-01-13 ENCOUNTER — Ambulatory Visit: Payer: BLUE CROSS/BLUE SHIELD | Admitting: Certified Nurse Midwife

## 2015-02-09 ENCOUNTER — Other Ambulatory Visit: Payer: Self-pay | Admitting: Certified Nurse Midwife

## 2015-02-09 NOTE — Telephone Encounter (Signed)
Medication refill request: OCP Last AEX:  01-09-14  Next AEX: 02-15-15 Last MMG (if hormonal medication request): Never Refill authorized: please advise

## 2015-02-15 ENCOUNTER — Ambulatory Visit (INDEPENDENT_AMBULATORY_CARE_PROVIDER_SITE_OTHER): Payer: BLUE CROSS/BLUE SHIELD | Admitting: Certified Nurse Midwife

## 2015-02-15 ENCOUNTER — Encounter: Payer: Self-pay | Admitting: Certified Nurse Midwife

## 2015-02-15 VITALS — BP 100/64 | HR 68 | Resp 16 | Ht 64.75 in | Wt 148.0 lb

## 2015-02-15 DIAGNOSIS — Z01419 Encounter for gynecological examination (general) (routine) without abnormal findings: Secondary | ICD-10-CM

## 2015-02-15 DIAGNOSIS — Z124 Encounter for screening for malignant neoplasm of cervix: Secondary | ICD-10-CM | POA: Diagnosis not present

## 2015-02-15 DIAGNOSIS — Z3041 Encounter for surveillance of contraceptive pills: Secondary | ICD-10-CM | POA: Diagnosis not present

## 2015-02-15 MED ORDER — NORETHINDRONE 0.35 MG PO TABS: 1.0000 | ORAL_TABLET | Freq: Every day | ORAL | Status: DC

## 2015-02-15 NOTE — Progress Notes (Signed)
40 y.o. G0P0000 Single  Caucasian Fe here for annual exam. Periods light and regular with POP. Needs new Rx. Sexually active with same partner twice yearly. No STD screening needed. Sees PCP yearly for aex/labs/hypothyroid management. Still smoking but has not increased past 1/2 pack a day. Still drinking 2 glasses of wine at late dinner. 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 No health issues today..  Patient's last menstrual period was 02/09/2015.          Sexually active: No.  The current method of family planning is oral progesterone-only contraceptive.    Exercising: Yes.    running & walking Smoker:  yes  Health Maintenance: Pap:  12-10-13 neg MMG:  none Colonoscopy:  none BMD:   none TDaP:  2015 Shingles: no Pneumonia: no Hep C and HIV: not done Labs: pcp Self breast exam: done occ   reports that she has been smoking Cigarettes.  She has been smoking about 0.50 packs per day. She does not have any smokeless tobacco history on file. She reports that she drinks alcohol. She reports that she does not use illicit drugs.  Past Medical History  Diagnosis Date  . Varicose veins   . Thyroid disease     hypothyroid  . Cancer [facility]) 2008    thyroid   . Migraines   . Abnormal Pap smear     2007 LGSIL ,2008 ASCUS +HPV, 12-10-13 neg HPV HR+    Past Surgical History  Procedure Laterality Date  . Total thyroidectomy  2008  . Colposcopy  10/16/2005    bx neg, ecc neg  . Colposcopy      10-16-05 bx & ecc neg    Current Outpatient Prescriptions  Medication Sig Dispense Refill  . aspirin-acetaminophen-caffeine (EXCEDRIN MIGRAINE) 250-250-65 MG per tablet Take by mouth every 6 (six) hours as needed for headache.    . levothyroxine (SYNTHROID, LEVOTHROID) 175 MCG tablet daily.  11  . ORTHO MICRONOR 0.35 MG tablet TAKE 1 TABLET BY MOUTH DAILY (Patient not taking: Reported on 02/15/2015) 28 tablet 0   No current  facility-administered medications for this visit.    History reviewed. No pertinent family history.  ROS:  Pertinent items are noted in HPI.  Otherwise, a comprehensive ROS was negative.  Exam:   BP 100/64 mmHg  Pulse 68  Resp 16  Ht 5' 4.75" (1.645 m)  Wt 148 lb (67.132 kg)  BMI 24.81 kg/m2  LMP 02/09/2015 Height: 5' 4.75" (164.5 cm) Ht Readings from Last 3 Encounters:  02/15/15 5' 4.75" (1.645 m)  01/04/14 5' 4.75" (1.645 m)  12/10/13 5' 4.75" (1.645 m)    General appearance: alert, cooperative and appears stated age Head: Normocephalic, without obvious abnormality, atraumatic Neck: no adenopathy, supple, symmetrical, trachea midline and thyroid absent Lungs: clear to auscultation bilaterally Breasts: normal appearance, no masses or tenderness, No nipple retraction or dimpling, No nipple discharge or bleeding, No axillary or supraclavicular adenopathy Heart: regular rate and rhythm Abdomen: soft, non-tender; no masses,  no organomegaly Extremities: extremities normal, atraumatic, no cyanosis or edema Skin: Skin color, texture, turgor normal. No rashes or lesions Lymph nodes: Cervical, supraclavicular, and axillary nodes normal. No abnormal inguinal nodes palpated Neurologic: Grossly normal   Pelvic: External genitalia:  no lesions              Urethra:  normal appearing urethra with no masses, tenderness or lesions  Bartholin's and Skene's: normal                 Vagina: normal appearing vagina with normal color and discharge, no lesions              Cervix: no cervical motion tenderness, no lesions and normal appearance              Pap taken: Yes.   Bimanual Exam:  Uterus:  normal size, contour, position, consistency, mobility, non-tender              Adnexa: normal adnexa and no mass, fullness, tenderness               Rectovaginal: Confirms               Anus:  normal sphincter tone, no lesions  Chaperone present: yes  A:  Well Woman with normal  exam  Contraception POP due to smoking  History of +HPVHR with colpo 2015 negative ECC, follow up pap today.  Mammogram screening due  Alcohol use daily ? Abuse, but bartender  Smoker not interested in cessation  Hypothyroid with PCP management  P:   Reviewed health and wellness pertinent to exam  Rx Camilla see order, patient will call if problems with generic, changed due to insurance coverage  Discussed risks/benefits of mammogram and ACOG/ACNM recommendations to begin at age 18. Given information to schedule. Questions addressed. Patient plans to schedule.  Discuss noted alcohol smell this am with appointment and questioned patient regarding and admits to late night use due to employment. Discussed my concern for daily use and increase risk of higher use which can be detrimental to health. Patient acknowledges she is aware and will watch use.  Declines HIV,Hep.C screening today will do at PCP visit.  Continue follow up as indicated.  Pap smear as above with HPVHR    counseled on breast self exam, mammography screening, STD prevention, HIV risk factors and prevention, adequate intake of calcium and vitamin D, diet and exercise  return annually or prn  An After Visit Summary was printed and given to the patient.

## 2015-02-15 NOTE — Patient Instructions (Signed)

## 2015-02-17 LAB — IPS PAP TEST WITH HPV

## 2015-02-18 ENCOUNTER — Telehealth: Payer: Self-pay

## 2015-02-18 NOTE — Telephone Encounter (Signed)
-----   Message from Regina Eck, CNM sent at 02/18/2015  7:42 AM EST ----- Notify patient that pap smear is negative and HPVHR not detected! 02 BV noted on pap if symptomatic will need Rx please advise

## 2015-02-18 NOTE — Progress Notes (Signed)
Reviewed personally.  M. Suzanne Zhyon Antenucci, MD.  

## 2015-02-18 NOTE — Telephone Encounter (Signed)
Patient notified of results. See lab 

## 2015-02-18 NOTE — Telephone Encounter (Signed)
Mailbox full. Try again. 

## 2016-02-17 ENCOUNTER — Ambulatory Visit: Payer: BLUE CROSS/BLUE SHIELD | Admitting: Certified Nurse Midwife

## 2016-03-01 ENCOUNTER — Ambulatory Visit (INDEPENDENT_AMBULATORY_CARE_PROVIDER_SITE_OTHER): Payer: BLUE CROSS/BLUE SHIELD | Admitting: Certified Nurse Midwife

## 2016-03-01 ENCOUNTER — Encounter: Payer: Self-pay | Admitting: Certified Nurse Midwife

## 2016-03-01 VITALS — BP 102/60 | HR 68 | Resp 16 | Ht 64.5 in | Wt 150.0 lb

## 2016-03-01 DIAGNOSIS — Z3041 Encounter for surveillance of contraceptive pills: Secondary | ICD-10-CM | POA: Diagnosis not present

## 2016-03-01 DIAGNOSIS — Z124 Encounter for screening for malignant neoplasm of cervix: Secondary | ICD-10-CM | POA: Diagnosis not present

## 2016-03-01 DIAGNOSIS — Z01419 Encounter for gynecological examination (general) (routine) without abnormal findings: Secondary | ICD-10-CM

## 2016-03-01 MED ORDER — NORETHINDRONE 0.35 MG PO TABS: 1.0000 | ORAL_TABLET | Freq: Every day | ORAL | 4 refills | Status: DC

## 2016-03-01 NOTE — Progress Notes (Signed)
41 y.o. G0P0000 Single  Caucasian Fe here for annual exam. Periods normal, no issues. Not sexually active. Has same relationship, just not sexually active. Sees PCP yearly for exam and Hypothyroid management. Contraception POP working well for cycle control. 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 No health issues today.  Patient's last menstrual period was 02/22/2016.          Sexually active: No.  The current method of family planning is oral progesterone-only contraceptive.    Exercising: No.  exercise Smoker:  yes  Health Maintenance: Pap:  12-10-13 neg HPV HR+ colpo done 01-05-14 neg, 02-15-15 neg HPV HR neg MMG:  None  Colonoscopy:  none BMD:   none TDaP:  2015 Shingles: no Pneumonia: no Hep C and HIV: not done Labs: declined Self breast exam: done occ   reports that she has been smoking Cigarettes.  She has been smoking about 0.50 packs per day. She has never used smokeless tobacco. She reports that she drinks alcohol. She reports that she does not use drugs.  Past Medical History:  Diagnosis Date  . Abnormal Pap smear    2007 LGSIL ,2008 ASCUS +HPV, 12-10-13 neg HPV HR+  . Cancer [facility]) 2008   thyroid   . Migraines   . Thyroid disease    hypothyroid  . Varicose veins     Past Surgical History:  Procedure Laterality Date  . COLPOSCOPY  10/16/2005   bx neg, ecc neg  . COLPOSCOPY     10-16-05 bx & ecc neg  . TOTAL THYROIDECTOMY  2008    Current Outpatient Prescriptions  Medication Sig Dispense Refill  . aspirin-acetaminophen-caffeine (EXCEDRIN MIGRAINE) 250-250-65 MG per tablet Take by mouth every 6 (six) hours as needed for headache.    . levothyroxine (SYNTHROID, LEVOTHROID) 175 MCG tablet daily.  11  . norethindrone (MICRONOR,CAMILA,ERRIN) 0.35 MG tablet Take 1 tablet (0.35 mg total) by mouth daily. 1 Package 12   No current facility-administered medications for this visit.     History reviewed. No pertinent family  history.  ROS:  Pertinent items are noted in HPI.  Otherwise, a comprehensive ROS was negative.  Exam:   BP 102/60   Pulse 68   Resp 16   Ht 5' 4.5" (1.638 m)   Wt 150 lb (68 kg)   LMP 02/22/2016   BMI 25.35 kg/m  Height: 5' 4.5" (163.8 cm) Ht Readings from Last 3 Encounters:  03/01/16 5' 4.5" (1.638 m)  02/15/15 5' 4.75" (1.645 m)  01/04/14 5' 4.75" (1.645 m)    General appearance: alert, cooperative and appears stated age Head: Normocephalic, without obvious abnormality, atraumatic Neck: no adenopathy, supple, symmetrical, trachea midline and thyroid normal to inspection and palpation and enlarged Lungs: clear to auscultation bilaterally Breasts: normal appearance, no masses or tenderness, No nipple retraction or dimpling, No nipple discharge or bleeding, No axillary or supraclavicular adenopathy Heart: regular rate and rhythm Abdomen: soft, non-tender; no masses,  no organomegaly Extremities: extremities normal, atraumatic, no cyanosis or edema Skin: Skin color, texture, turgor normal. No rashes or lesions Lymph nodes: Cervical, supraclavicular, and axillary nodes normal. No abnormal inguinal nodes palpated Neurologic: Grossly normal   Pelvic: External genitalia:  no lesions              Urethra:  normal appearing urethra with no masses, tenderness or lesions              Bartholin's and Skene's: normal  Vagina: normal appearing vagina with normal color and discharge, no lesions              Cervix: no cervical motion tenderness, no lesions and normal appearance              Pap taken: Yes.   Bimanual Exam:  Uterus:  normal size, contour, position, consistency, mobility, non-tender              Adnexa: normal adnexa and no mass, fullness, tenderness               Rectovaginal: Confirms               Anus:  normal sphincter tone, no lesions  Chaperone present: yes  A:  Well Woman with normal exam  Contraception POP desired for cycle control  also  Mammogram due  Smoker working on decreasing amount, not interested in cessation.  P:   Reviewed health and wellness pertinent to exam Rx Camilla see order with instruction  Given information to schedule and importance of screening . Patient will schedule  Pap smear as above with HPVHR   counseled on breast self exam, STD prevention, HIV risk factors and prevention, use and side effects of OCP's, adequate intake of calcium and vitamin D, diet and exercise  return annually or prn  An After Visit Summary was printed and given to the patient.

## 2016-03-01 NOTE — Patient Instructions (Signed)

## 2016-03-03 NOTE — Progress Notes (Signed)
Encounter reviewed Livie Vanderhoof, MD   

## 2016-03-06 ENCOUNTER — Telehealth: Payer: Self-pay

## 2016-03-06 ENCOUNTER — Other Ambulatory Visit: Payer: Self-pay

## 2016-03-06 LAB — IPS PAP TEST WITH HPV

## 2016-03-06 MED ORDER — METRONIDAZOLE 0.75 % VA GEL: VAGINAL | 0 refills | Status: DC

## 2016-03-06 NOTE — Telephone Encounter (Signed)
-----   Message from Regina Eck, CNM sent at 03/06/2016  9:06 AM EST ----- Pap smear negative HPVHR not detected. 02 BV noted on pap, if symptomatic will need Rx Metrogel 1 applicator per vagina bid x 5 days, no refill

## 2016-03-06 NOTE — Telephone Encounter (Signed)
lmtcb

## 2016-03-06 NOTE — Telephone Encounter (Signed)
Patient notified of results. See lab 

## 2017-03-05 ENCOUNTER — Ambulatory Visit: Payer: BC Managed Care – PPO | Admitting: Certified Nurse Midwife

## 2017-03-05 ENCOUNTER — Encounter: Payer: Self-pay | Admitting: Certified Nurse Midwife

## 2017-03-05 VITALS — BP 114/70 | HR 70 | Resp 16 | Ht 64.5 in | Wt 157.0 lb

## 2017-03-05 DIAGNOSIS — Z720 Tobacco use: Secondary | ICD-10-CM

## 2017-03-05 DIAGNOSIS — Z3041 Encounter for surveillance of contraceptive pills: Secondary | ICD-10-CM | POA: Diagnosis not present

## 2017-03-05 DIAGNOSIS — Z01419 Encounter for gynecological examination (general) (routine) without abnormal findings: Secondary | ICD-10-CM | POA: Diagnosis not present

## 2017-03-05 MED ORDER — NORETHINDRONE 0.35 MG PO TABS: 1.0000 | ORAL_TABLET | Freq: Every day | ORAL | 4 refills | Status: DC

## 2017-03-05 NOTE — Progress Notes (Signed)
42 y.o. G0P0000 Single Caucasian Fe here for annual exam.Periods rare with POP two a year. Last period was 5 day duration but with bleeding moderate only. Sees Dr. Quay Burow yearly with labs/hypothyroid management and aex. Medication is stable. 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 Sexually active no partner change, no STD screening needed. Has decreased smoking and continues to work on cessation. No other health issues today. 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  Patient's last menstrual period was 02/25/2017.          Sexually active: Yes.    The current method of family planning is oral progesterone-only contraceptive.    Exercising: Yes.    running & walking Smoker:  yes  Health Maintenance: Pap:  02-15-15 neg HPV HR neg, 03-01-16 neg HPV HR neg History of Abnormal Pap: yes MMG:  none Self Breast exams: yes Colonoscopy:  none BMD:   none TDaP:  2015 Shingles: no Pneumonia: no Hep C and HIV: not done Labs: if needed.   reports that she has been smoking cigarettes.  She has been smoking about 0.50 packs per day. she has never used smokeless tobacco. She reports that she drinks alcohol. She reports that she does not use drugs.  Past Medical History:  Diagnosis Date  . Abnormal Pap smear    2007 LGSIL ,2008 ASCUS +HPV, 12-10-13 neg HPV HR+  . Cancer [facility]) 2008   thyroid   . Migraines   . Thyroid disease    hypothyroid  . Varicose veins     Past Surgical History:  Procedure Laterality Date  . COLPOSCOPY     10-16-05 bx & ecc neg, 2015 ECC neg  . TOTAL THYROIDECTOMY  2008    Current Outpatient Medications  Medication Sig Dispense Refill  . aspirin-acetaminophen-caffeine (EXCEDRIN MIGRAINE) 250-250-65 MG per tablet Take by mouth every 6 (six) hours as needed for headache.    . DiphenhydrAMINE HCl (BENADRYL PO) Take by mouth.    . levothyroxine (SYNTHROID, LEVOTHROID) 175 MCG tablet daily.  11  . norethindrone (MICRONOR,CAMILA,ERRIN)  0.35 MG tablet Take 1 tablet (0.35 mg total) by mouth daily. 3 Package 4  . Pseudoephedrine-Ibuprofen (ADVIL COLD/SINUS PO) Take by mouth.     No current facility-administered medications for this visit.     History reviewed. No pertinent family history.  ROS:  Pertinent items are noted in HPI.  Otherwise, a comprehensive ROS was negative.  Exam:   BP 114/70   Pulse 70   Resp 16   Ht 5' 4.5" (1.638 m)   Wt 157 lb (71.2 kg)   LMP 02/25/2017   BMI 26.53 kg/m  Height: 5' 4.5" (163.8 cm) Ht Readings from Last 3 Encounters:  03/05/17 5' 4.5" (1.638 m)  03/01/16 5' 4.5" (1.638 m)  02/15/15 5' 4.75" (1.645 m)    General appearance: alert, cooperative and appears stated age Head: Normocephalic, without obvious abnormality, atraumatic Neck: no adenopathy, supple, symmetrical, trachea midline and thyroid normal to inspection and palpation Lungs: clear to auscultation bilaterally Breasts: normal appearance, no masses or tenderness, No nipple retraction or dimpling, No nipple discharge or bleeding, No axillary or supraclavicular adenopathy Heart: regular rate and rhythm Abdomen: soft, non-tender; no masses,  no organomegaly Extremities: extremities normal, atraumatic, no cyanosis or edema Skin: Skin color, texture, turgor normal. No rashes or lesions Lymph nodes: Cervical, supraclavicular, and axillary nodes normal. No abnormal inguinal nodes palpated Neurologic: Grossly normal   Pelvic: External genitalia:  no lesions  Urethra:  normal appearing urethra with no masses, tenderness or lesions              Bartholin's and Skene's: normal                 Vagina: normal appearing vagina with normal color and discharge, no lesions              Cervix: no cervical motion tenderness, no lesions and normal appearance with LEEP effect              Pap taken: No. Bimanual Exam:  Uterus:  normal size, contour, position, consistency, mobility, non-tender              Adnexa: normal  adnexa and no mass, fullness, tenderness               Rectovaginal: Confirms               Anus:  normal sphincter tone, no lesions  Chaperone present: yes  A:  Well Woman with normal exam  Contraception POP due to smoker  Hypothyroid with PCP management  P:   Reviewed health and wellness pertinent to exam  Discussed risks/benefits/warning signs with POP use. Desires continuance.  Rx Micronor see order with instructions  Continue follow up with PCP as indicated  Pap smear: no   counseled on breast self exam, mammography screening and need to schedule, given information for Screening facilities,, STD prevention, HIV risk factors and prevention, use and side effects of OCP's, adequate intake of calcium and vitamin D, diet and exercise  return annually or prn  An After Visit Summary was printed and given to the patient.

## 2017-03-05 NOTE — Patient Instructions (Signed)

## 2017-07-29 ENCOUNTER — Other Ambulatory Visit: Payer: Self-pay | Admitting: Certified Nurse Midwife

## 2017-07-29 DIAGNOSIS — Z3041 Encounter for surveillance of contraceptive pills: Secondary | ICD-10-CM

## 2017-07-29 NOTE — Telephone Encounter (Signed)
Medication refill request: Norlyda 0.35 MG  Last AEX:  03/05/17 Next AEX: 03/06/18 Last MMG (if hormonal medication request): NA Refill authorized: Please refill if appropriate.

## 2017-07-30 NOTE — Telephone Encounter (Signed)
Encouraged patient to have mammogram at last visit. Would like her to do before next exam. Will refill OCP to aex, but will need mammogram by next visit.

## 2018-02-03 ENCOUNTER — Other Ambulatory Visit: Payer: Self-pay | Admitting: Certified Nurse Midwife

## 2018-02-03 DIAGNOSIS — Z3041 Encounter for surveillance of contraceptive pills: Secondary | ICD-10-CM

## 2018-02-03 NOTE — Telephone Encounter (Signed)
Medication refill request:Amy Bonilla Last AEX:  03/05/17 Next AEX: 03/06/18 Last MMG (if hormonal medication request): never Refill authorized: 1 pack 0 rf to get her to her appointment

## 2018-03-06 ENCOUNTER — Other Ambulatory Visit: Payer: Self-pay

## 2018-03-06 ENCOUNTER — Ambulatory Visit: Payer: BC Managed Care – PPO | Admitting: Certified Nurse Midwife

## 2018-03-06 ENCOUNTER — Encounter: Payer: Self-pay | Admitting: Certified Nurse Midwife

## 2018-03-06 VITALS — BP 120/70 | HR 68 | Resp 16 | Ht 64.75 in | Wt 159.0 lb

## 2018-03-06 DIAGNOSIS — Z01419 Encounter for gynecological examination (general) (routine) without abnormal findings: Secondary | ICD-10-CM

## 2018-03-06 DIAGNOSIS — Z3041 Encounter for surveillance of contraceptive pills: Secondary | ICD-10-CM

## 2018-03-06 MED ORDER — NORETHINDRONE 0.35 MG PO TABS: ORAL_TABLET | ORAL | 4 refills | Status: DC

## 2018-03-06 NOTE — Progress Notes (Signed)
43 y.o. G0P0000 Single  Caucasian Fe here for annual exam.  Periods scant to none. Still smoking has cut back, doesn't smoke in daytime. No partner change or STD screening needed. Sees Dr. Quay Burow for labs and medication management for hypothyroid and Migraines. All stable . Has full time job as Pharmacist, hospital now! No other health issues today.  Patient's last menstrual period was 02/10/2018 (exact date).          Sexually active: Yes.    The current method of family planning is OCP progesterone only pill.    Exercising: Yes.    walking & jogging Smoker:  yes  Review of Systems  Constitutional: Negative.   HENT: Negative.   Eyes: Negative.   Respiratory: Negative.   Cardiovascular: Negative.   Gastrointestinal: Negative.   Genitourinary: Negative.   Musculoskeletal: Negative.   Skin: Negative.   Neurological: Negative.   Endo/Heme/Allergies: Negative.   Psychiatric/Behavioral: Negative.     Health Maintenance: Pap:  02-15-15 neg HPV HR neg, 03-01-16 neg HPV HR neg History of Abnormal Pap: yes MMG:  none Self Breast exams: yes Colonoscopy:  none BMD:   none TDaP:  2015 Shingles: no Pneumonia: no Hep C and HIV: not done Labs: no   reports that she has been smoking cigarettes. She has been smoking about 0.50 packs per day. She has never used smokeless tobacco. She reports current alcohol use of about 2.0 - 3.0 standard drinks of alcohol per week. She reports that she does not use drugs.  Past Medical History:  Diagnosis Date  . Abnormal Pap smear    2007 LGSIL ,2008 ASCUS +HPV, 12-10-13 neg HPV HR+  . Cancer [facility]) 2008   thyroid   . Migraines   . Thyroid disease    hypothyroid  . Varicose veins     Past Surgical History:  Procedure Laterality Date  . COLPOSCOPY     10-16-05 bx & ecc neg, 2015 ECC neg  . TOTAL THYROIDECTOMY  2008    Current Outpatient Medications  Medication Sig Dispense Refill  . aspirin-acetaminophen-caffeine (EXCEDRIN MIGRAINE) 250-250-65 MG per tablet  Take by mouth every 6 (six) hours as needed for headache.    . DiphenhydrAMINE HCl (BENADRYL PO) Take by mouth.    . levothyroxine (SYNTHROID, LEVOTHROID) 175 MCG tablet daily.  11  . NORLYDA 0.35 MG tablet TAKE 1 TABLET(0.35 MG) BY MOUTH DAILY 1 Package 0  . Pseudoephedrine-Ibuprofen (ADVIL COLD/SINUS PO) Take by mouth.     No current facility-administered medications for this visit.     History reviewed. No pertinent family history.  ROS:  Pertinent items are noted in HPI.  Otherwise, a comprehensive ROS was negative.  Exam:   BP 120/70   Pulse 68   Resp 16   Ht 5' 4.75" (1.645 m)   Wt 159 lb (72.1 kg)   LMP 02/10/2018 (Exact Date)   BMI 26.66 kg/m  Height: 5' 4.75" (164.5 cm) Ht Readings from Last 3 Encounters:  03/06/18 5' 4.75" (1.645 m)  03/05/17 5' 4.5" (1.638 m)  03/01/16 5' 4.5" (1.638 m)    General appearance: alert, cooperative and appears stated age Head: Normocephalic, without obvious abnormality, atraumatic Neck: no adenopathy, supple, symmetrical, trachea midline and thyroid normal to inspection and palpation Lungs: clear to auscultation bilaterally Breasts: normal appearance, no masses or tenderness, No nipple retraction or dimpling, No nipple discharge or bleeding, No axillary or supraclavicular adenopathy Heart: regular rate and rhythm Abdomen: soft, non-tender; no masses,  no organomegaly Extremities: extremities  normal, atraumatic, no cyanosis or edema Skin: Skin color, texture, turgor normal. No rashes or lesions Lymph nodes: Cervical, supraclavicular, and axillary nodes normal. No abnormal inguinal nodes palpated Neurologic: Grossly normal   Pelvic: External genitalia:  no lesions              Urethra:  normal appearing urethra with no masses, tenderness or lesions              Bartholin's and Skene's: normal                 Vagina: normal appearing vagina with normal color and discharge, no lesions              Cervix: no cervical motion tenderness  and no lesions              Pap taken: No. Bimanual Exam:  Uterus:  normal size, contour, position, consistency, mobility, non-tender and anteverted              Adnexa: normal adnexa and no mass, fullness, tenderness               Rectovaginal: Confirms               Anus:  normal sphincter tone, no lesions  Chaperone present: yes  A:  Well Woman with normal exam  Contraception POP working well.  Hypothyroid management with PCP  P:   Reviewed health and wellness pertinent to exam  Risks/benefits/warning signs given. Desires Rx  Rx Norlyda see order with instructions  Pap smear: no   counseled on breast self exam, mammography screening, STD prevention, HIV risk factors and prevention, feminine hygiene, use and side effects of OCP's, adequate intake of calcium and vitamin D, diet and exercise  return annually or prn  An After Visit Summary was printed and given to the patient.

## 2018-05-08 ENCOUNTER — Other Ambulatory Visit: Payer: Self-pay | Admitting: Certified Nurse Midwife

## 2018-05-08 DIAGNOSIS — Z3041 Encounter for surveillance of contraceptive pills: Secondary | ICD-10-CM

## 2018-05-08 NOTE — Telephone Encounter (Signed)
Medication refill request: Norlyda Last AEX:  03/06/18 Next AEX: 03/11/19 DL Last MMG (if hormonal medication request): none Refill authorized: 03/06/18 #3packs/4R to [facility] [address]

## 2018-08-13 ENCOUNTER — Other Ambulatory Visit: Payer: Self-pay | Admitting: Certified Nurse Midwife

## 2018-08-13 DIAGNOSIS — Z3041 Encounter for surveillance of contraceptive pills: Secondary | ICD-10-CM

## 2019-03-11 ENCOUNTER — Ambulatory Visit: Payer: BC Managed Care – PPO | Admitting: Certified Nurse Midwife

## 2019-03-21 ENCOUNTER — Ambulatory Visit: Payer: Self-pay | Attending: Internal Medicine

## 2019-03-21 ENCOUNTER — Other Ambulatory Visit: Payer: Self-pay

## 2019-03-21 DIAGNOSIS — Z23 Encounter for immunization: Secondary | ICD-10-CM | POA: Insufficient documentation

## 2019-03-21 NOTE — Progress Notes (Signed)
   Covid-19 Vaccination Clinic  Name:  KAYCIA GARMON    MRN: [id number] DOB: 06-05-1975  03/21/2019  Ms. Chism was observed post Covid-19 immunization for 15 minutes without incidence. She was provided with Vaccine Information Sheet and instruction to access the V-Safe system.   Ms. Bankson was instructed to call 911 with any severe reactions post vaccine: Marland Kitchen Difficulty breathing  . Swelling of your face and throat  . A fast heartbeat  . A bad rash all over your body  . Dizziness and weakness    Immunizations Administered    Name Date Dose VIS Date Route   Pfizer COVID-19 Vaccine 03/21/2019  1:34 PM 0.3 mL 01/02/2019 Intramuscular   Manufacturer: [place]   Lot: [id number]   [place]: [id number]

## 2019-04-10 ENCOUNTER — Encounter: Payer: Self-pay | Admitting: Certified Nurse Midwife

## 2019-04-10 ENCOUNTER — Ambulatory Visit: Payer: BC Managed Care – PPO | Admitting: Certified Nurse Midwife

## 2019-04-11 ENCOUNTER — Ambulatory Visit: Payer: Self-pay | Attending: Internal Medicine

## 2019-04-11 DIAGNOSIS — Z23 Encounter for immunization: Secondary | ICD-10-CM

## 2019-04-11 NOTE — Progress Notes (Signed)
   Covid-19 Vaccination Clinic  Name:  JEANEAN NOLLER    MRN: [id number] DOB: February 26, 1975  04/11/2019  Ms. Sperling was observed post Covid-19 immunization for 15 minutes without incident. She was provided with Vaccine Information Sheet and instruction to access the V-Safe system.   Ms. Rehl was instructed to call 911 with any severe reactions post vaccine: Marland Kitchen Difficulty breathing  . Swelling of face and throat  . A fast heartbeat  . A bad rash all over body  . Dizziness and weakness   Immunizations Administered    Name Date Dose VIS Date Route   Pfizer COVID-19 Vaccine 04/11/2019  1:31 PM 0.3 mL 01/02/2019 Intramuscular   Manufacturer: [place]   Lot: [id number]   [place]: [id number]

## 2019-04-21 NOTE — Progress Notes (Signed)
44 y.o. G0P0000 Single Other or two or more races Not Hispanic or Latino female here for annual exam.  Patient states that she found a lump under her left breast on her rib cage. She noticed the lump 2 months ago, mildly tender, no change in size.  She is on POP, cycles every 2-3 months. Fine.  She lives with her partner of 10 years, no sexually active in the last year, fine with it.  Period Duration (Days): 3 Period Pattern: (!) Irregular Menstrual Flow: Light Menstrual Control: Tampon Menstrual Control Change Freq (Hours): 2 Dysmenorrhea: None  Patient's last menstrual period was 03/20/2019 (within days).          Sexually active: Yes.    The current method of family planning is POP (estrogen/progesterone).    Exercising: Walking yes Smoker:  Yes 0.5 PPD, would like to quit.   Health Maintenance: Pap:  03/01/16 normal HR HPV neg, 02/15/15 Normal HR HPV Neg  History of abnormal Pap:  Yes LSIL in 2007, no surgery on her cervix.  MMG:  None  BMD:   None  Colonoscopy: none  TDaP:  12/10/13 Gardasil: NA   reports that she has been smoking cigarettes. She has been smoking about 0.50 packs per day. She has never used smokeless tobacco. She reports current alcohol use of about 2.0 - 3.0 standard drinks of alcohol per week. 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   Past Medical History:  Diagnosis Date  . Abnormal Pap smear    2007 LGSIL ,2008 ASCUS +HPV, 12-10-13 neg HPV HR+  . Cancer [facility]) 2008   thyroid   . Migraines   . Thyroid disease    hypothyroid  . Varicose veins     Past Surgical History:  Procedure Laterality Date  . COLPOSCOPY     10-16-05 bx & ecc neg, 2015 ECC neg  . TOTAL THYROIDECTOMY  2008    Current Outpatient Medications  Medication Sig Dispense Refill  . aspirin-acetaminophen-caffeine (EXCEDRIN MIGRAINE) 250-250-65 MG per tablet Take by mouth every 6 (six) hours as needed for headache.    . DiphenhydrAMINE HCl  (BENADRYL PO) Take by mouth.    . Levothyroxine Sodium 175 MCG CAPS Take by mouth daily.    . norethindrone (NORLYDA) 0.35 MG tablet TAKE 1 TABLET(0.35 MG) BY MOUTH DAILY 3 Package 4   No current facility-administered medications for this visit.    History reviewed. No pertinent family history.  Review of Systems  All other systems reviewed and are negative.   Exam:   BP 122/70   Pulse 78   Temp 99.1 F (37.3 C)   Ht 5\' 5"  (1.651 m)   Wt 166 lb (75.3 kg)   LMP 03/20/2019 (Within Days)   SpO2 98%   BMI 27.62 kg/m   Weight change: @WEIGHTCHANGE @ Height:   Height: 5\' 5"  (165.1 cm)  Ht Readings from Last 3 Encounters:  04/22/19 5\' 5"  (1.651 m)  03/06/18 5' 4.75" (1.645 m)  03/05/17 5' 4.5" (1.638 m)    General appearance: alert, cooperative and appears stated age Head: Normocephalic, without obvious abnormality, atraumatic Neck: no adenopathy, supple, symmetrical, trachea midline and thyroid non-palpable and no masses Lungs: clear to auscultation bilaterally Cardiovascular: regular rate and rhythm Breasts: normal appearance, no masses or tenderness Abdomen: soft, non-tender; non distended,  no masses,  no organomegaly Extremities: extremities normal, atraumatic, no cyanosis or edema Skin: Skin color, texture, turgor normal. No rashes or  lesions Lymph nodes: Cervical, supraclavicular, and axillary nodes normal. No abnormal inguinal nodes palpated Neurologic: Grossly normal   Pelvic: External genitalia:  no lesions              Urethra:  normal appearing urethra with no masses, tenderness or lesions              Bartholins and Skenes: normal                 Vagina: normal appearing vagina with normal color and discharge, no lesions              Cervix: no lesions               Bimanual Exam:  Uterus:  normal size, contour, position, consistency, mobility, non-tender and anteverted              Adnexa: no mass, fullness, tenderness               Rectovaginal: Confirms                Anus:  normal sphincter tone, no lesions  Terence Lux chaperoned for the exam.  A:  Well Woman with normal exam  Smoker, discussed quitting, information given   Contraception, refill for POP sent  P:   No pap this year  Discussed breast self exam  Discussed calcium and vit D intake  Discussed breast self exam  Discussed calcium and vit D intake  Encouraged her to get a mammogram

## 2019-04-22 ENCOUNTER — Ambulatory Visit: Payer: BC Managed Care – PPO | Admitting: Obstetrics and Gynecology

## 2019-04-22 ENCOUNTER — Other Ambulatory Visit: Payer: Self-pay

## 2019-04-22 ENCOUNTER — Encounter: Payer: Self-pay | Admitting: Obstetrics and Gynecology

## 2019-04-22 VITALS — BP 122/70 | HR 78 | Temp 99.1°F | Ht 65.0 in | Wt 166.0 lb

## 2019-04-22 DIAGNOSIS — F17200 Nicotine dependence, unspecified, uncomplicated: Secondary | ICD-10-CM | POA: Diagnosis not present

## 2019-04-22 DIAGNOSIS — Z3041 Encounter for surveillance of contraceptive pills: Secondary | ICD-10-CM | POA: Diagnosis not present

## 2019-04-22 DIAGNOSIS — Z01419 Encounter for gynecological examination (general) (routine) without abnormal findings: Secondary | ICD-10-CM | POA: Diagnosis not present

## 2019-04-22 DIAGNOSIS — Z0000 Encounter for general adult medical examination without abnormal findings: Secondary | ICD-10-CM | POA: Diagnosis not present

## 2019-04-22 MED ORDER — NORETHINDRONE 0.35 MG PO TABS: ORAL_TABLET | ORAL | 4 refills | Status: DC

## 2019-04-22 NOTE — Patient Instructions (Addendum)
Mammogram Facilities  Yearly screening mammograms are recommended for women beginning at age 45. For a routine screening mammogram, you may schedule the appointment and have it done at the location of your choice.  Please ask the facility to send the results to our office. (fax [phone]) Location options include:  *[facility], [place], [place] [address] [address], [place], [place] [address] [phone]  EXERCISE AND DIET:  We recommended that you start or continue a regular exercise program for good health. Regular exercise means any activity that makes your heart beat faster and makes you sweat.  We recommend exercising at least 30 minutes per day at least 3 days a week, preferably 4 or 5.  We also recommend a diet low in fat and sugar.  Inactivity, poor dietary choices and obesity can cause diabetes, heart attack, stroke, and kidney damage, among others.    ALCOHOL AND SMOKING:  Women should limit their alcohol intake to no more than 7 drinks/beers/glasses of wine (combined, not each!) per week. Moderation of alcohol intake to this level decreases your risk of breast cancer and liver damage. And of course, no recreational drugs are part of a healthy lifestyle.  And absolutely no smoking or even second hand smoke. Most people know smoking can cause heart and lung diseases, but did you know it also contributes to weakening of your bones? Aging of your skin?  Yellowing of your teeth and nails?  CALCIUM AND VITAMIN D:  Adequate intake of calcium and Vitamin D are recommended.  The recommendations for exact amounts of these supplements seem to change often, but generally speaking 1,000 mg of calcium (between diet and supplement) and 800 units of Vitamin D per day seems prudent. Certain women may benefit from higher intake of Vitamin D.  If you are among these women, your doctor will have told  you during your visit.    PAP SMEARS:  Pap smears, to check for cervical cancer or precancers,  have traditionally been done yearly, although recent scientific advances have shown that most women can have pap smears less often.  However, every woman still should have a physical exam from her gynecologist every year. It will include a breast check, inspection of the vulva and vagina to check for abnormal growths or skin changes, a visual exam of the cervix, and then an exam to evaluate the size and shape of the uterus and ovaries.  And after 45 years of age, a rectal exam is indicated to check for rectal cancers. We will also provide age appropriate advice regarding health maintenance, like when you should have certain vaccines, screening for sexually transmitted diseases, bone density testing, colonoscopy, mammograms, etc.   MAMMOGRAMS:  All women over 40 years old should have a yearly mammogram. Many facilities now offer a "3D" mammogram, which may cost around $50 extra out of pocket. If possible,  we recommend you accept the option to have the 3D mammogram performed.  It both reduces the number of women who will be called back for extra views which then turn out to be normal, and it is better than the routine mammogram at detecting truly abnormal areas.    COLON CANCER SCREENING: Now recommend starting at age 43. At this time colonoscopy is not covered for routine screening until 50. There are take home tests that can be done between 45-49.   COLONOSCOPY:  Colonoscopy to screen for  colon cancer is recommended for all women at age 27.  We know, you hate the idea of the prep.  We agree, BUT, having colon cancer and not knowing it is worse!!  Colon cancer so often starts as a polyp that can be seen and removed at colonscopy, which can quite literally save your life!  And if your first colonoscopy is normal and you have no family history of colon cancer, most women don't have to have it again for 10 years.   Once every ten years, you can do something that may end up saving your life, right?  We will be happy to help you get it scheduled when you are ready.  Be sure to check your insurance coverage so you understand how much it will cost.  It may be covered as a preventative service at no cost, but you should check your particular policy.      Breast Self-Awareness Breast self-awareness means being familiar with how your breasts look and feel. It involves checking your breasts regularly and reporting any changes to your health care provider. Practicing breast self-awareness is important. A change in your breasts can be a sign of a serious medical problem. Being familiar with how your breasts look and feel allows you to find any problems early, when treatment is more likely to be successful. All women should practice breast self-awareness, including women who have had breast implants. How to do a breast self-exam One way to learn what is normal for your breasts and whether your breasts are changing is to do a breast self-exam. To do a breast self-exam: Look for Changes  1. Remove all the clothing above your waist. 2. Stand in front of a mirror in a room with good lighting. 3. Put your hands on your hips. 4. Push your hands firmly downward. 5. Compare your breasts in the mirror. Look for differences between them (asymmetry), such as: ? Differences in shape. ? Differences in size. ? Puckers, dips, and bumps in one breast and not the other. 6. Look at each breast for changes in your skin, such as: ? Redness. ? Scaly areas. 7. Look for changes in your nipples, such as: ? Discharge. ? Bleeding. ? Dimpling. ? Redness. ? A change in position. Feel for Changes Carefully feel your breasts for lumps and changes. It is best to do this while lying on your back on the floor and again while sitting or standing in the shower or tub with soapy water on your skin. Feel each breast in the following  way:  Place the arm on the side of the breast you are examining above your head.  Feel your breast with the other hand.  Start in the nipple area and make  inch (2 cm) overlapping circles to feel your breast. Use the pads of your three middle fingers to do this. Apply light pressure, then medium pressure, then firm pressure. The light pressure will allow you to feel the tissue closest to the skin. The medium pressure will allow you to feel the tissue that is a little deeper. The firm pressure will allow you to feel the tissue close to the ribs.  Continue the overlapping circles, moving downward over the breast until you feel your ribs below your breast.  Move one finger-width toward the center of the body. Continue to use the  inch (2 cm) overlapping circles to feel your breast as you move slowly up toward your collarbone.  Continue the up and down exam  using all three pressures until you reach your armpit.  Write Down What You Find  Write down what is normal for each breast and any changes that you find. Keep a written record with breast changes or normal findings for each breast. By writing this information down, you do not need to depend only on memory for size, tenderness, or location. Write down where you are in your menstrual cycle, if you are still menstruating. If you are having trouble noticing differences in your breasts, do not get discouraged. With time you will become more familiar with the variations in your breasts and more comfortable with the exam. How often should I examine my breasts? Examine your breasts every month. If you are breastfeeding, the best time to examine your breasts is after a feeding or after using a breast pump. If you menstruate, the best time to examine your breasts is 5-7 days after your period is over. During your period, your breasts are lumpier, and it may be more difficult to notice changes. When should I see my health care provider? See your health  care provider if you notice:  A change in shape or size of your breasts or nipples.  A change in the skin of your breast or nipples, such as a reddened or scaly area.  Unusual discharge from your nipples.  A lump or thick area that was not there before.  Pain in your breasts.  Anything that concerns you.   Steps to Quit Smoking Smoking tobacco is the leading cause of preventable death. It can affect almost every organ in the body. Smoking puts you and those around you at risk for developing many serious chronic diseases. Quitting smoking can be difficult, but it is one of the best things that you can do for your health. It is never too late to quit. How do I get ready to quit? When you decide to quit smoking, create a plan to help you succeed. Before you quit:  Pick a date to quit. Set a date within the next 2 weeks to give you time to prepare.  Write down the reasons why you are quitting. Keep this list in places where you will see it often.  Tell your family, friends, and co-workers that you are quitting. Support from your loved ones can make quitting easier.  Talk with your health care provider about your options for quitting smoking.  Find out what treatment options are covered by your health insurance.  Identify people, places, things, and activities that make you want to smoke (triggers). Avoid them. What first steps can I take to quit smoking?  Throw away all cigarettes at home, at work, and in your car.  Throw away smoking accessories, such as Scientist, research (medical).  Clean your car. Make sure to empty the ashtray.  Clean your home, including curtains and carpets. What strategies can I use to quit smoking? Talk with your health care provider about combining strategies, such as taking medicines while you are also receiving in-person counseling. Using these two strategies together makes you more likely to succeed in quitting than if you used either strategy on its  own.  If you are pregnant or breastfeeding, talk with your health care provider about finding counseling or other support strategies to quit smoking. Do not take medicine to help you quit smoking unless your health care provider tells you to do so. To quit smoking: Quit right away  Quit smoking completely, instead of gradually reducing how much you  smoke over a period of time. Research shows that stopping smoking right away is more successful than gradually quitting.  Attend in-person counseling to help you build problem-solving skills. You are more likely to succeed in quitting if you attend counseling sessions regularly. Even short sessions of 10 minutes can be effective. Take medicine You may take medicines to help you quit smoking. Some medicines require a prescription and some you can purchase over-the-counter. Medicines may have nicotine in them to replace the nicotine in cigarettes. Medicines may:  Help to stop cravings.  Help to relieve withdrawal symptoms. Your health care provider may recommend:  Nicotine patches, gum, or lozenges.  Nicotine inhalers or sprays.  Non-nicotine medicine that is taken by mouth. Find resources Find resources and support systems that can help you to quit smoking and remain smoke-free after you quit. These resources are most helpful when you use them often. They include:  Online chats with a Social worker.  Telephone quitlines.  Printed Furniture conservator/restorer.  Support groups or group counseling.  Text messaging programs.  Mobile phone apps or applications. Use apps that can help you stick to your quit plan by providing reminders, tips, and encouragement. There are many free apps for mobile devices as well as websites. Examples include Quit Guide from the [employer] and smokefree.gov What things can I do to make it easier to quit?   Reach out to your family and friends for support and encouragement. Call telephone quitlines (1-800-QUIT-NOW), reach out to  support groups, or work with a counselor for support.  Ask people who smoke to avoid smoking around you.  Avoid places that trigger you to smoke, such as bars, parties, or smoke-break areas at work.  Spend time with people who do not smoke.  Lessen the stress in your life. Stress can be a smoking trigger for some people. To lessen stress, try: ? Exercising regularly. ? Doing deep-breathing exercises. ? Doing yoga. ? Meditating. ? Performing a body scan. This involves closing your eyes, scanning your body from head to toe, and noticing which parts of your body are particularly tense. Try to relax the muscles in those areas. How will I feel when I quit smoking? Day 1 to 3 weeks Within the first 24 hours of quitting smoking, you may start to feel withdrawal symptoms. These symptoms are usually most noticeable 2-3 days after quitting, but they usually do not last for more than 2-3 weeks. You may experience these symptoms:  Mood swings.  Restlessness, anxiety, or irritability.  Trouble concentrating.  Dizziness.  Strong cravings for sugary foods and nicotine.  Mild weight gain.  Constipation.  Nausea.  Coughing or a sore throat.  Changes in how the medicines that you take for unrelated issues work in your body.  Depression.  Trouble sleeping (insomnia). Week 3 and afterward After the first 2-3 weeks of quitting, you may start to notice more positive results, such as:  Improved sense of smell and taste.  Decreased coughing and sore throat.  Slower heart rate.  Lower blood pressure.  Clearer skin.  The ability to breathe more easily.  Fewer sick days. Quitting smoking can be very challenging. Do not get discouraged if you are not successful the first time. Some people need to make many attempts to quit before they achieve long-term success. Do your best to stick to your quit plan, and talk with your health care provider if you have any questions or  concerns. Summary  Smoking tobacco is the leading cause of preventable  death. Quitting smoking is one of the best things that you can do for your health.  When you decide to quit smoking, create a plan to help you succeed.  Quit smoking right away, not slowly over a period of time.  When you start quitting, seek help from your health care provider, family, or friends. This information is not intended to replace advice given to you by your health care provider. Make sure you discuss any questions you have with your health care provider. Document Revised: 10/03/2018 Document Reviewed: 03/29/2018 Elsevier Patient Education  [employer].

## 2019-04-23 LAB — CBC
Hematocrit: 41.3 % (ref 34.0–46.6)
Hemoglobin: 14.5 g/dL (ref 11.1–15.9)
MCH: 33.8 pg — ABNORMAL HIGH (ref 26.6–33.0)
MCHC: 35.1 g/dL (ref 31.5–35.7)
MCV: 96 fL (ref 79–97)
Platelets: 306 10*3/uL (ref 150–450)
RBC: 4.29 x10E6/uL (ref 3.77–5.28)
RDW: 11.5 % — ABNORMAL LOW (ref 11.7–15.4)
WBC: 6.3 10*3/uL (ref 3.4–10.8)

## 2019-04-23 LAB — COMPREHENSIVE METABOLIC PANEL
ALT: 22 IU/L (ref 0–32)
AST: 31 IU/L (ref 0–40)
Albumin/Globulin Ratio: 2 (ref 1.2–2.2)
Albumin: 4.6 g/dL (ref 3.8–4.8)
Alkaline Phosphatase: 84 IU/L (ref 39–117)
BUN/Creatinine Ratio: 13 (ref 9–23)
BUN: 8 mg/dL (ref 6–24)
Bilirubin Total: 0.7 mg/dL (ref 0.0–1.2)
CO2: 22 mmol/L (ref 20–29)
Calcium: 9.6 mg/dL (ref 8.7–10.2)
Chloride: 97 mmol/L (ref 96–106)
Creatinine, Ser: 0.63 mg/dL (ref 0.57–1.00)
GFR calc Af Amer: 126 mL/min/{1.73_m2} (ref >59)
GFR calc non Af Amer: 109 mL/min/{1.73_m2} (ref >59)
Globulin, Total: 2.3 g/dL (ref 1.5–4.5)
Glucose: 81 mg/dL (ref 65–99)
Potassium: 4 mmol/L (ref 3.5–5.2)
Sodium: 136 mmol/L (ref 134–144)
Total Protein: 6.9 g/dL (ref 6.0–8.5)

## 2019-04-23 LAB — LIPID PANEL
Chol/HDL Ratio: 2.8 ratio (ref 0.0–4.4)
Cholesterol, Total: 195 mg/dL (ref 100–199)
HDL: 70 mg/dL (ref >39)
LDL Chol Calc (NIH): 109 mg/dL — ABNORMAL HIGH (ref 0–99)
Triglycerides: 87 mg/dL (ref 0–149)
VLDL Cholesterol Cal: 16 mg/dL (ref 5–40)

## 2020-02-01 ENCOUNTER — Ambulatory Visit: Payer: BC Managed Care – PPO | Admitting: Obstetrics and Gynecology

## 2020-02-01 NOTE — Progress Notes (Deleted)
GYNECOLOGY  VISIT   HPI: 45 y.o.   Single Other or two or more races Not Hispanic or Latino  female   G0P0000 with No LMP recorded.   here for     GYNECOLOGIC HISTORY: No LMP recorded. Contraception:Norlyda Menopausal hormone therapy: none        OB History    Gravida  0   Para  0   Term  0   Preterm  0   AB  0   Living  0     SAB  0   IAB  0   Ectopic  0   Multiple  0   Live Births                 Patient Active Problem List   Diagnosis Date Noted  . Varicose veins of lower extremities with other complications 62/95/2841  . 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 Abnormal Pap smear    2007 LGSIL ,2008 ASCUS +HPV, 12-10-13 neg HPV HR+  . Cancer [facility]) 2008   thyroid   . Migraines   . Thyroid disease    hypothyroid  . Varicose veins     Past Surgical History:  Procedure Laterality Date  . COLPOSCOPY     10-16-05 bx & ecc neg, 2015 ECC neg  . TOTAL THYROIDECTOMY  2008    Current Outpatient Medications  Medication Sig Dispense Refill  . aspirin-acetaminophen-caffeine (EXCEDRIN MIGRAINE) 250-250-65 MG per tablet Take by mouth every 6 (six) hours as needed for headache.    . DiphenhydrAMINE HCl (BENADRYL PO) Take by mouth.    . Levothyroxine Sodium 175 MCG CAPS Take by mouth daily.    . norethindrone (NORLYDA) 0.35 MG tablet TAKE 1 TABLET(0.35 MG) BY MOUTH DAILY 3 Package 4   No current facility-administered medications for this visit.     ALLERGIES: Pollen extract  No family history on file.  Social History   Socioeconomic History  . Marital status: Single    Spouse name: Not on file  . Number of children: Not on file  . Years of education: Not on file  . Highest education level: Not on file  Occupational History  . Not on file  Tobacco Use  . Smoking status: Current Every Day Smoker    Packs/day: 0.50    Types: Cigarettes  . Smokeless  tobacco: Never Used  Substance and Sexual Activity  . Alcohol use: Yes    Alcohol/week: 2.0 - 3.0 standard drinks    Types: 2 - 3 Glasses of wine per week  . Drug use: No  . Sexual activity: Yes    Partners: Male    Birth control/protection: Pill  Other Topics Concern  . Not on file  Social History Narrative  . Not on file   Social Determinants of Health   Financial Resource Strain: Not on file  Food Insecurity: Not on file  Transportation Needs: Not on file  Physical Activity: Not on file  Stress: Not on file  Social Connections: Not on file  Intimate Partner Violence: Not on file    ROS  PHYSICAL EXAMINATION:    There were no vitals taken for this visit.    General appearance: alert, cooperative and appears stated age Neck: no adenopathy, supple, symmetrical, trachea midline and thyroid {CHL AMB PHY EX THYROID NORM DEFAULT:[phone]::"normal to inspection and palpation"} Breasts: {Exam; breast:13139::"normal appearance, no masses or tenderness"} Abdomen:  soft, non-tender; non distended, no masses,  no organomegaly  Pelvic: External genitalia:  no lesions              Urethra:  normal appearing urethra with no masses, tenderness or lesions              Bartholins and Skenes: normal                 Vagina: normal appearing vagina with normal color and discharge, no lesions              Cervix: {CHL AMB PHY EX CERVIX NORM DEFAULT:[phone]::"no lesions"}              Bimanual Exam:  Uterus:  {CHL AMB PHY EX UTERUS NORM DEFAULT:[phone]::"normal size, contour, position, consistency, mobility, non-tender"}              Adnexa: {CHL AMB PHY EX ADNEXA NO MASS DEFAULT:[id number]::"no mass, fullness, tenderness"}              Rectovaginal: {yes no:[id number]}.  Confirms.              Anus:  normal sphincter tone, no lesions  Chaperone was present for exam.  ASSESSMENT     PLAN    An After Visit Summary was printed and given to the patient.  *** minutes face to face  time of which over 50% was spent in counseling.

## 2020-02-02 NOTE — Progress Notes (Signed)
GYNECOLOGY  VISIT   HPI: 45 y.o.   Single Other or two or more races Not Hispanic or Latino  female   G0P0000 with Patient's last menstrual period was 01/08/2020 (within days).   here for right breast lump that is under nipple per patient. Per patient redness at the area with puss that drained from the site.   Last week she noticed some pain on her areolar region, developed redness. She pushed on the redness and some pus came out of her nipple. It is much less tender, redness is improving. Not draining spontaneously, only happened one time.   GYNECOLOGIC HISTORY: Patient's last menstrual period was 01/08/2020 (within days). Contraception:Norlyda Menopausal hormone therapy: none        OB History    Gravida  0   Para  0   Term  0   Preterm  0   AB  0   Living  0     SAB  0   IAB  0   Ectopic  0   Multiple  0   Live Births                 Patient Active Problem List   Diagnosis Date Noted  . Varicose veins of lower extremities with other complications 09/47/0962  . 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 Carcinoma of thyroid gland ([place]) 02/22/2011    Past Medical History:  Diagnosis Date  . Abnormal Pap smear    2007 LGSIL ,2008 ASCUS +HPV, 12-10-13 neg HPV HR+  . Cancer [facility]) 2008   thyroid   . Migraines   . Thyroid disease    hypothyroid  . Varicose veins     Past Surgical History:  Procedure Laterality Date  . COLPOSCOPY     10-16-05 bx & ecc neg, 2015 ECC neg  . TOTAL THYROIDECTOMY  2008    Current Outpatient Medications  Medication Sig Dispense Refill  . aspirin-acetaminophen-caffeine (EXCEDRIN MIGRAINE) 250-250-65 MG per tablet Take by mouth every 6 (six) hours as needed for headache.    . DiphenhydrAMINE HCl (BENADRYL PO) Take by mouth.    . Levothyroxine Sodium 175 MCG CAPS Take by mouth daily.    . norethindrone (NORLYDA) 0.35 MG tablet TAKE 1 TABLET(0.35 MG) BY MOUTH DAILY 3 Package 4   No current facility-administered medications  for this visit.     ALLERGIES: Pollen extract  History reviewed. No pertinent family history.  Social History   Socioeconomic History  . Marital status: Single    Spouse name: Not on file  . Number of children: Not on file  . Years of education: Not on file  . Highest education level: Not on file  Occupational History  . Not on file  Tobacco Use  . Smoking status: Current Every Day Smoker    Packs/day: 0.50    Types: Cigarettes  . Smokeless tobacco: Never Used  Substance and Sexual Activity  . Alcohol use: Yes    Alcohol/week: 2.0 - 3.0 standard drinks    Types: 2 - 3 Glasses of wine per week  . Drug use: No  . Sexual activity: Yes    Partners: Male    Birth control/protection: Pill  Other Topics Concern  . Not on file  Social History Narrative  . Not on file   Social Determinants of Health   Financial Resource Strain: Not on file  Food Insecurity: Not on file  Transportation Needs: Not on file  Physical Activity: Not on file  Stress: Not  on file  Social Connections: Not on file  Intimate Partner Violence: Not on file    Review of Systems  Constitutional:       Right breast lump  HENT: Negative.   Eyes: Negative.   Respiratory: Negative.   Cardiovascular: Negative.   Gastrointestinal: Negative.   Genitourinary: Negative.   Musculoskeletal: Negative.   Skin: Negative.   Neurological: Negative.   Endo/Heme/Allergies: Negative.   Psychiatric/Behavioral: Negative.     PHYSICAL EXAMINATION:    BP 120/80 (BP Location: Right Arm, Patient Position: Sitting, Cuff Size: Large)   Pulse 72   Resp 18   Ht 5\' 5"  (1.651 m)   Wt 153 lb (69.4 kg)   LMP 01/08/2020 (Within Days) Comment: spotting  BMI 25.46 kg/m     General appearance: alert, cooperative and appears stated age Breasts: in the right areola at 3 o'clock is an ~ 1 cm indurated, raised, erythematous lump, minimally tender, mild surrounding erythema. No nipple d/c. No other breast lumps, no dimpling.    No supraclavicular or axillary adenopathy.    ASSESSMENT Spontaneously resolving right breast abscess in the right areola, s/p minimal drainage from her nipple (suspect abscess in the duct). The patient states she is 90% improved.     PLAN Given the spontaneous improvement will hold on antibiotic therapy She will use warm compress and hot soaks If she has any worsening in symptoms she will call and I will start antibiotics Will set up diagnostic breast imaging  Over 20 minutes in total patient care.

## 2020-02-03 ENCOUNTER — Other Ambulatory Visit: Payer: Self-pay

## 2020-02-03 ENCOUNTER — Ambulatory Visit: Payer: BC Managed Care – PPO | Admitting: Obstetrics and Gynecology

## 2020-02-03 ENCOUNTER — Encounter: Payer: Self-pay | Admitting: Obstetrics and Gynecology

## 2020-02-03 VITALS — BP 120/80 | HR 72 | Resp 18 | Ht 65.0 in | Wt 153.0 lb

## 2020-02-03 DIAGNOSIS — N611 Abscess of the breast and nipple: Secondary | ICD-10-CM

## 2020-02-03 DIAGNOSIS — N6452 Nipple discharge: Secondary | ICD-10-CM | POA: Diagnosis not present

## 2020-02-03 NOTE — Patient Instructions (Signed)
Mastitis  Mastitis is irritation and swelling (inflammation) in an area of the breast. It is caused by an infection that occurs when germs (bacteria) enter the skin. This most often happens to breastfeeding mothers, but it can happen to other women too as well as some men. What are the causes? This condition is caused by:  Germs. This can happen when germs enter the breast through cuts or openings in the skin.  A plugged milk duct. This happens when something blocks the flow of milk in the breast.  Nipple piercing. The pierced area can allow germs to enter the breast.  Some types of breast cancer. What are the signs or symptoms?  Swelling, redness, and pain in the breast.  Swelling of the glands under the arm.  Fluid flowing from the nipple.  Fever.  Chills.  Vomiting or feeling like you may vomit.  Fast heart rate.  Feeling very tired.  Headache.  Muscle aches.  A build-up of pus. This is also called an abscess. How is this treated? This condition may be treated with:  Using heat or cold compresses.  Taking medicine for pain.  Taking antibiotic medicine.  Rest.  Drinking plenty of fluid.  Removing fluid with a needle, if an abscess has developed. Follow these instructions at home: If you are breastfeeding:  Keep emptying your breasts by breastfeeding or by using a breast pump. ? Ask your doctor if you should make changes to your breast feeding or pumping routine.  During breastfeeding, empty the first breast before going to the other breast. Use a breast pump if your baby is not emptying your breasts.  Keep your nipples clean and dry.  Massage your breasts during feeding or pumping as told by your doctor.  If told, put moist heat on the affected area of your breast right before breastfeeding or pumping. Use the heat source that your doctor tells you to use.  If told, put ice on the affected area of your breast right after breastfeeding or pumping. To  do this: ? Put ice in a plastic bag. ? Place a towel between your skin and the bag. ? Leave the ice on for 20 minutes. ? Take off the ice if your skin turns bright red. This is very important. If you cannot feel pain, heat, or cold, you have a greater risk of damage to the area.  If you go back to work, pump your breasts while at work.  Avoid letting your breasts get overly filled with milk (engorged).   Medicines  Take over-the-counter and prescription medicines only as told by your doctor.  If you were prescribed an antibiotic medicine, take it as told by your doctor. Do not stop taking it even if you start to feel better. General instructions  Do not wear a tight or underwire bra. Wear a soft support bra.  Drink enough fluid to keep your pee (urine) pale yellow. This is important if you have a fever.  Get plenty of rest.  Keep all follow-up visits. Contact a doctor if:  You have pus-like fluid leaking from your breast.  You have a fever.  Your symptoms do not get better within 2 days. Get help right away if:  Your pain and swelling are getting worse.  Your pain is not helped by medicine.  You have a red line going from your breast toward your armpit. Summary  Mastitis is irritation and swelling in an area of the breast.  If you were prescribed an   antibiotic medicine, do not stop taking it even if you start to feel better.  Drink more fluids and get plenty of rest.  Contact a doctor if your symptoms do not get better within 2 days. This information is not intended to replace advice given to you by your health care provider. Make sure you discuss any questions you have with your health care provider. Document Revised: 11/09/2019 Document Reviewed: 11/09/2019 Elsevier Patient Education  2021 Elsevier Inc.  

## 2020-02-04 ENCOUNTER — Telehealth: Payer: Self-pay | Admitting: *Deleted

## 2020-02-04 DIAGNOSIS — N6452 Nipple discharge: Secondary | ICD-10-CM

## 2020-02-04 NOTE — Telephone Encounter (Signed)
-----   Message from Salvadore Dom, MD sent at 02/03/2020  8:43 AM EST ----- Please set this patient up for diagnostic breast imaging at the breast center for right breast abscess (resolving spontaneously), s/p drainage from her nipple.  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 Thanks, Sharee Pimple

## 2020-02-04 NOTE — Telephone Encounter (Signed)
Appointment scheduled on 03/14/20 @ 7:20am at the breast center,this was next available appointment. Detailed message left on cell per DPR access.

## 2020-03-14 ENCOUNTER — Other Ambulatory Visit: Payer: Self-pay

## 2020-04-25 ENCOUNTER — Ambulatory Visit: Payer: BC Managed Care – PPO | Admitting: Obstetrics and Gynecology

## 2020-04-28 ENCOUNTER — Other Ambulatory Visit: Payer: Self-pay | Admitting: *Deleted

## 2020-04-28 DIAGNOSIS — Z3041 Encounter for surveillance of contraceptive pills: Secondary | ICD-10-CM

## 2020-04-28 NOTE — Telephone Encounter (Signed)
Med refill request: Norlyda 0.35mg  tab. LAst filled 04/22/19, #3pkg/4RF Last AEX: 04/22/19 Next AEX: 08/02/20 Last MMG (if hormonal med):  Patient was seen on office on 02/03/20 for right breast abscess, bilateral Dx MMG and right breast US scheduled. Imaging not completed or rescheduled to date.   Refill authorized: Dr. Talbert Nan, Please Advise on refill?

## 2020-04-29 MED ORDER — NORETHINDRONE 0.35 MG PO TABS
ORAL_TABLET | ORAL | 0 refills | Status: DC
Start: 2020-04-29 — End: 2020-06-27

## 2020-04-29 NOTE — Telephone Encounter (Signed)
Call placed to patient, left detailed message, ok per dpr. Advised as seen below per Dr. Talbert Nan. Return call to office if any additional assistance is needed with scheduling MMG or questions.   Encounter closed.

## 2020-04-29 NOTE — Telephone Encounter (Signed)
Please let the patient know that a 3 month supply of micronor was sent to her pharmacy. She really needs a mammogram if she is going to continue on hormones. Please ask her to set up her appointment.

## 2020-06-27 ENCOUNTER — Other Ambulatory Visit: Payer: Self-pay | Admitting: Obstetrics and Gynecology

## 2020-06-27 DIAGNOSIS — Z1231 Encounter for screening mammogram for malignant neoplasm of breast: Secondary | ICD-10-CM

## 2020-06-27 DIAGNOSIS — Z3041 Encounter for surveillance of contraceptive pills: Secondary | ICD-10-CM

## 2020-06-27 NOTE — Telephone Encounter (Signed)
Medication refill request: norlyda 0.35 Last AEX:  04-22-19 Next AEX: 08-02-20 Last MMG (if hormonal medication request): none Refill authorized: please approve if appropriate

## 2020-08-02 ENCOUNTER — Other Ambulatory Visit: Payer: Self-pay

## 2020-08-02 ENCOUNTER — Ambulatory Visit (INDEPENDENT_AMBULATORY_CARE_PROVIDER_SITE_OTHER): Payer: BC Managed Care – PPO | Admitting: Obstetrics and Gynecology

## 2020-08-02 ENCOUNTER — Encounter: Payer: Self-pay | Admitting: Obstetrics and Gynecology

## 2020-08-02 VITALS — BP 126/74 | HR 71 | Ht 64.5 in | Wt 160.8 lb

## 2020-08-02 DIAGNOSIS — Z01419 Encounter for gynecological examination (general) (routine) without abnormal findings: Secondary | ICD-10-CM | POA: Diagnosis not present

## 2020-08-02 DIAGNOSIS — Z3009 Encounter for other general counseling and advice on contraception: Secondary | ICD-10-CM | POA: Diagnosis not present

## 2020-08-02 DIAGNOSIS — Z1211 Encounter for screening for malignant neoplasm of colon: Secondary | ICD-10-CM | POA: Diagnosis not present

## 2020-08-02 DIAGNOSIS — Z124 Encounter for screening for malignant neoplasm of cervix: Secondary | ICD-10-CM | POA: Diagnosis not present

## 2020-08-02 MED ORDER — NORETHINDRONE 0.35 MG PO TABS: 1.0000 | ORAL_TABLET | Freq: Every day | ORAL | 3 refills | Status: DC

## 2020-08-02 MED ORDER — NORETHINDRONE 0.35 MG PO TABS: 1.0000 | ORAL_TABLET | Freq: Every day | ORAL | 11 refills | Status: DC

## 2020-08-02 NOTE — Progress Notes (Signed)
45 y.o. G0P0000 Single Other or two or more races Not Hispanic or Latino female here for annual exam.  Patient states that when she was shaving today she felt a bump .  She went of of POP a couple of months ago (she was overdue for her mammogram). She thinks she went off of POP in ~3/22, she has had 2 cycles since then. She thinks her cycles were 4-6 weeks apart.  Period Duration (Days): 3 Period Pattern: (!) Irregular Menstrual Flow: Light Menstrual Control: Tampon Menstrual Control Change Freq (Hours): 6 Dysmenorrhea: None She lives with her partner. They haven't been sexually active for 1.5 years, thinks he may have some ED. They are happy. She would like to go back on POP, in case they become sexually active again.  Patient's last menstrual period was 07/15/2020 (approximate).          Sexually active: No.  The current method of family planning is none Exercising: Yes.     Walking   and running  Smoker:  yes, <1PPD, thinks about quitting.   Health Maintenance: Pap:   03/01/16 normal HR HPV neg, 02/15/15 Normal HR HPV Neg  History of abnormal Pap:  yes LSIL in 2007, no surgery on her cervix.  MMG:  scheduled at the breast center 08/19/20 BMD:   na Colonoscopy: none  TDaP:  12/10/13 Gardasil: NA   reports that she has been smoking cigarettes. She has been smoking an average of 0.50 packs per day. She has never used smokeless tobacco. She reports current alcohol use of about 2.0 - 3.0 standard drinks of alcohol per week. She reports that she does not use drugs. 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   Past Medical History:  Diagnosis Date   Abnormal Pap smear    2007 LGSIL ,2008 ASCUS +HPV, 12-10-13 neg HPV HR+   Cancer ([place]) 2008   thyroid    Migraines    Thyroid disease    hypothyroid   Varicose veins     Past Surgical History:  Procedure Laterality Date   COLPOSCOPY     10-16-05 bx & ecc neg, 2015 ECC neg   TOTAL THYROIDECTOMY  2008    Current Outpatient Medications   Medication Sig Dispense Refill   aspirin-acetaminophen-caffeine (EXCEDRIN MIGRAINE) 250-250-65 MG per tablet Take by mouth every 6 (six) hours as needed for headache.     DiphenhydrAMINE HCl (BENADRYL PO) Take by mouth.     levothyroxine (SYNTHROID) 150 MCG tablet Take 150 mcg by mouth daily.     No current facility-administered medications for this visit.    History reviewed. No pertinent family history.  Review of Systems  All other systems reviewed and are negative.  Exam:   BP 126/74   Pulse 71   Ht 5' 4.5" (1.638 m)   Wt 160 lb 12.8 oz (72.9 kg)   LMP 07/15/2020 (Approximate)   SpO2 100%   BMI 27.17 kg/m   Weight change: @WEIGHTCHANGE @ Height:   Height: 5' 4.5" (163.8 cm)  Ht Readings from Last 3 Encounters:  08/02/20 5' 4.5" (1.638 m)  02/03/20 5\' 5"  (1.651 m)  04/22/19 5\' 5"  (1.651 m)    General appearance: alert, cooperative and appears stated age Head: Normocephalic, without obvious abnormality, atraumatic Neck: no adenopathy, supple, symmetrical, trachea midline and thyroid normal to inspection and palpation Lungs: clear to auscultation bilaterally Cardiovascular: regular rate and rhythm Breasts: normal appearance, no masses or tenderness Abdomen: soft, non-tender; non distended,  no masses,  no organomegaly Extremities: extremities normal, atraumatic, no cyanosis or edema Skin: Skin color, texture, turgor normal. No rashes or lesions Lymph nodes: Cervical, supraclavicular, and axillary nodes normal. No abnormal inguinal nodes palpated Neurologic: Grossly normal   Pelvic: External genitalia:  no lesions              Urethra:  normal appearing urethra with no masses, tenderness or lesions              Bartholins and Skenes: normal                 Vagina: normal appearing vagina with normal color and discharge, no lesions              Cervix: no lesions               Bimanual Exam:  Uterus:  normal size, contour, position, consistency, mobility, non-tender               Adnexa: no mass, fullness, tenderness               Rectovaginal: Confirms               Anus:  normal sphincter tone, no lesions  Gae Dry chaperoned for the exam.  1. Well woman exam Discussed breast self exam Discussed calcium and vit D intake Mammogram is scheduled She will do her labs with her primary  2. General counseling and advice on female contraception Will restart POP - norethindrone (ORTHO MICRONOR) 0.35 MG tablet; Take 1 tablet (0.35 mg total) by mouth daily.  Dispense: 84 tablet; Refill: 3  3. Colon cancer screening Discussed options, she will consider

## 2020-08-03 ENCOUNTER — Encounter: Payer: Self-pay | Admitting: Obstetrics and Gynecology

## 2020-08-03 NOTE — Patient Instructions (Signed)

## 2020-08-08 ENCOUNTER — Other Ambulatory Visit (HOSPITAL_COMMUNITY)
Admission: RE | Admit: 2020-08-08 | Discharge: 2020-08-08 | Disposition: A | Discharge status: Home / Self Care | Payer: BC Managed Care – PPO | Source: Ambulatory Visit | Attending: Obstetrics and Gynecology | Admitting: Obstetrics and Gynecology

## 2020-08-08 DIAGNOSIS — Z124 Encounter for screening for malignant neoplasm of cervix: Secondary | ICD-10-CM | POA: Diagnosis present

## 2020-08-08 NOTE — Addendum Note (Signed)
Addended by: Dorothy Spark on: 08/08/2020 08:21 AM   Modules accepted: Orders

## 2020-08-10 LAB — CYTOLOGY - PAP
Adequacy: ABSENT
Comment: NEGATIVE
Diagnosis: NEGATIVE
High risk HPV: NEGATIVE

## 2020-08-18 ENCOUNTER — Telehealth: Payer: Self-pay

## 2020-08-18 NOTE — Telephone Encounter (Signed)
Patient called because when she picked up her new Rx for bcp's prescribed in July the name had changed. I reassured her that Dr. Talbert Nan prescribed the same progestin only bcp  Norethindrone 0.'35mg'$ .  I did advise her that pharmacy sometimes feels it with a different brand generic and name changes. I told her to check her insert before she takes the new on and make sure it is Norethindrone 0.35 mg.

## 2020-08-19 ENCOUNTER — Ambulatory Visit
Admission: RE | Admit: 2020-08-19 | Discharge: 2020-08-19 | Disposition: A | Discharge status: Home / Self Care | Payer: BC Managed Care – PPO | Source: Ambulatory Visit | Attending: Obstetrics and Gynecology | Admitting: Obstetrics and Gynecology

## 2020-08-19 ENCOUNTER — Other Ambulatory Visit: Payer: Self-pay

## 2020-08-19 DIAGNOSIS — Z1231 Encounter for screening mammogram for malignant neoplasm of breast: Secondary | ICD-10-CM

## 2021-02-27 ENCOUNTER — Other Ambulatory Visit: Payer: Self-pay | Admitting: Obstetrics and Gynecology

## 2021-02-27 DIAGNOSIS — Z3009 Encounter for other general counseling and advice on contraception: Secondary | ICD-10-CM

## 2021-02-27 NOTE — Telephone Encounter (Signed)
Spoke with pharmacy and they have ortho Micronor 0.35 mg tablet, they will refill this Rx. Heather 0.35 mg tablet denied.

## 2021-08-02 NOTE — Progress Notes (Deleted)
46 y.o. G0P0000 Single Other or two or more races Not Hispanic or Latino female here for annual exam.      No LMP recorded. (Menstrual status: Oral contraceptives).          Sexually active: {yes no:314532}  The current method of family planning is {contraception:315051}.    Exercising: {yes no:314532}  {types:19826} Smoker:  {YES [id number]  Health Maintenance: Pap:  08/08/20 WNL HR HPV Neg, 03/01/16 normal HR HPV neg History of abnormal Pap:   Yes LSIL in 2007, no surgery on her cervix.  MMG:  none BMD:   none  Colonoscopy: none  TDaP:  12/10/13  Gardasil: n/a   reports that she has been smoking cigarettes. She has been smoking an average of .5 packs per day. She has never used smokeless tobacco. She reports current alcohol use of about 2.0 - 3.0 standard drinks of alcohol per week. She reports that she does not use drugs.  Past Medical History:  Diagnosis Date   Abnormal Pap smear    2007 LGSIL ,2008 ASCUS +HPV, 12-10-13 neg HPV HR+   Cancer ([place]) 2008   thyroid    Migraines    Thyroid disease    hypothyroid   Varicose veins     Past Surgical History:  Procedure Laterality Date   COLPOSCOPY     10-16-05 bx & ecc neg, 2015 ECC neg   TOTAL THYROIDECTOMY  2008    Current Outpatient Medications  Medication Sig Dispense Refill   aspirin-acetaminophen-caffeine (EXCEDRIN MIGRAINE) 250-250-65 MG per tablet Take by mouth every 6 (six) hours as needed for headache.     DiphenhydrAMINE HCl (BENADRYL PO) Take by mouth.     levothyroxine (SYNTHROID) 150 MCG tablet Take 150 mcg by mouth daily.     norethindrone (ORTHO MICRONOR) 0.35 MG tablet Take 1 tablet (0.35 mg total) by mouth daily. 84 tablet 3   No current facility-administered medications for this visit.    No family history on file.  Review of Systems  Exam:   There were no vitals taken for this visit.  Weight change: '@WEIGHTCHANGE'$ @ Height:      Ht Readings from Last 3 Encounters:  08/02/20 5' 4.5" (1.638 m)  02/03/20  '5\' 5"'$  (1.651 m)  04/22/19 '5\' 5"'$  (1.651 m)    General appearance: alert, cooperative and appears stated age Head: Normocephalic, without obvious abnormality, atraumatic Neck: no adenopathy, supple, symmetrical, trachea midline and thyroid {CHL AMB PHY EX THYROID NORM DEFAULT:[phone]::"normal to inspection and palpation"} Lungs: clear to auscultation bilaterally Cardiovascular: regular rate and rhythm Breasts: {Exam; breast:13139::"normal appearance, no masses or tenderness"} Abdomen: soft, non-tender; non distended,  no masses,  no organomegaly Extremities: extremities normal, atraumatic, no cyanosis or edema Skin: Skin color, texture, turgor normal. No rashes or lesions Lymph nodes: Cervical, supraclavicular, and axillary nodes normal. No abnormal inguinal nodes palpated Neurologic: Grossly normal   Pelvic: External genitalia:  no lesions              Urethra:  normal appearing urethra with no masses, tenderness or lesions              Bartholins and Skenes: normal                 Vagina: normal appearing vagina with normal color and discharge, no lesions              Cervix: {CHL AMB PHY EX CERVIX NORM [phone]::"no lesions"}  Bimanual Exam:  Uterus:  {CHL AMB PHY EX UTERUS NORM DEFAULT:[phone]::"normal size, contour, position, consistency, mobility, non-tender"}              Adnexa: {CHL AMB PHY EX ADNEXA NO MASS DEFAULT:[phone]::"no mass, fullness, tenderness"}               Rectovaginal: Confirms               Anus:  normal sphincter tone, no lesions  *** chaperoned for the exam.  A:  Well Woman with normal exam  P:

## 2021-08-03 ENCOUNTER — Other Ambulatory Visit: Payer: Self-pay | Admitting: Obstetrics and Gynecology

## 2021-08-03 DIAGNOSIS — Z1231 Encounter for screening mammogram for malignant neoplasm of breast: Secondary | ICD-10-CM

## 2021-08-09 ENCOUNTER — Ambulatory Visit: Payer: BC Managed Care – PPO | Admitting: Obstetrics and Gynecology

## 2021-08-29 ENCOUNTER — Ambulatory Visit
Admission: RE | Admit: 2021-08-29 | Discharge: 2021-08-29 | Disposition: A | Discharge status: Home / Self Care | Payer: BC Managed Care – PPO | Source: Ambulatory Visit | Attending: Obstetrics and Gynecology | Admitting: Obstetrics and Gynecology

## 2021-08-29 DIAGNOSIS — Z1231 Encounter for screening mammogram for malignant neoplasm of breast: Secondary | ICD-10-CM

## 2021-10-10 NOTE — Progress Notes (Signed)
46 y.o. G0P0000 Single Other or two or more races Not Hispanic or Latino female here for annual exam.  She went off of POP's in July. She would like to restart them.  LMP was in June. Always has irregular cycles. Typically bleeds for 3 days, light flow, no cramps.  No vasomotor symptoms, no vaginal dryness. Not sexually active.   Her primary manages her thyroid.   She has had nipple d/c on the right 2 times. 1 x she was seen and was noted to have an abscess.     No LMP recorded. (Menstrual status: Oral contraceptives).          Sexually active: No.  The current method of family planning none.     Exercising: Yes.     Pe teacher  Smoker:  yes 1/2 pack    Health Maintenance: Pap:  08/08/20 WNL Hr Hpv Neg;  03/01/16 normal HR HPV neg, History of abnormal Pap:   yes LSIL in 2007, no surgery on her cervix.  MMG:  08/29/21 Bi-rads 1 neg  BMD:   na  Colonoscopy: none  TDaP:  12/10/13 Gardasil: n/a   reports that she has been smoking cigarettes. She has been smoking an average of .5 packs per day. She has never used smokeless tobacco. She reports current alcohol use of about 2.0 - 3.0 standard drinks of alcohol per week. She reports that she does not use drugs. 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   Past Medical History:  Diagnosis Date   Abnormal Pap smear    2007 LGSIL ,2008 ASCUS +HPV, 12-10-13 neg HPV HR+   Cancer ([place]) 2008   thyroid    Migraines    Thyroid disease    hypothyroid   Varicose veins     Past Surgical History:  Procedure Laterality Date   COLPOSCOPY     10-16-05 bx & ecc neg, 2015 ECC neg   TOTAL THYROIDECTOMY  2008    Current Outpatient Medications  Medication Sig Dispense Refill   aspirin-acetaminophen-caffeine (EXCEDRIN MIGRAINE) 250-250-65 MG per tablet Take by mouth every 6 (six) hours as needed for headache.     DiphenhydrAMINE HCl (BENADRYL PO) Take by mouth.     levothyroxine (SYNTHROID) 150 MCG tablet Take 150 mcg by mouth daily.     norethindrone  (ORTHO MICRONOR) 0.35 MG tablet Take 1 tablet (0.35 mg total) by mouth daily. (Patient not taking: Reported on 10/18/2021) 84 tablet 3   No current facility-administered medications for this visit.    Family History  Problem Relation Age of Onset   Breast cancer Neg Hx     Review of Systems  All other systems reviewed and are negative.   Exam:   BP 110/68   Pulse 72   Ht 5' 4.5" (1.638 m)   Wt 148 lb (67.1 kg)   SpO2 99%   BMI 25.01 kg/m   Weight change: '@WEIGHTCHANGE'$ @ Height:   Height: 5' 4.5" (163.8 cm)  Ht Readings from Last 3 Encounters:  10/18/21 5' 4.5" (1.638 m)  08/02/20 5' 4.5" (1.638 m)  02/03/20 '5\' 5"'$  (1.651 m)    General appearance: alert, cooperative and appears stated age Head: Normocephalic, without obvious abnormality, atraumatic Neck: no adenopathy, supple, symmetrical, trachea midline and thyroid  absent Lungs: clear to auscultation bilaterally Cardiovascular: regular rate and rhythm Breasts: normal appearance, no masses or tenderness Abdomen: soft, non-tender; non distended,  no masses,  no organomegaly Extremities: extremities normal, atraumatic, no cyanosis or edema Skin:  Skin color, texture, turgor normal. No rashes or lesions Lymph nodes: Cervical, supraclavicular, and axillary nodes normal. No abnormal inguinal nodes palpated Neurologic: Grossly normal   Pelvic: External genitalia:  no lesions              Urethra:  normal appearing urethra with no masses, tenderness or lesions              Bartholins and Skenes: normal                 Vagina: well estrogenized appearing vagina with normal color and discharge, no lesions              Cervix: no lesions               Bimanual Exam:  Uterus:  normal size, contour, position, consistency, mobility, non-tender              Adnexa: no mass, fullness, tenderness               Rectovaginal: Confirms               Anus:  normal sphincter tone, no lesions  Gae Dry, CMA chaperoned for the  exam  1. Well woman exam Discussed breast self exam Discussed calcium and vit D intake Labs with primary  2. Oligomenorrhea, unspecified type Long term - Follicle stimulating hormone - Prolactin  3. Colon cancer screening Discussed options for screening.  - Ambulatory referral to Gastroenterology  4. General counseling and advice on female contraception Wants to restart POP's - norethindrone (ORTHO MICRONOR) 0.35 MG tablet; Take 1 tablet (0.35 mg total) by mouth daily.  Dispense: 84 tablet; Refill: 3

## 2021-10-11 ENCOUNTER — Ambulatory Visit: Payer: BC Managed Care – PPO | Admitting: Obstetrics and Gynecology

## 2021-10-15 ENCOUNTER — Other Ambulatory Visit: Payer: Self-pay | Admitting: Obstetrics and Gynecology

## 2021-10-15 DIAGNOSIS — Z3009 Encounter for other general counseling and advice on contraception: Secondary | ICD-10-CM

## 2021-10-17 NOTE — Telephone Encounter (Signed)
Rx request came in for micronor. Patient has appointment for her annual exam tomorrow. I called patient & left her a message letting her know that we are going to deny the rx since she has an appointment tomorrow & that she can get a new rx then. However, she was told that if she is out of pills & doesn't have one to take for today to call us back so we could send the rx in.

## 2021-10-18 ENCOUNTER — Encounter: Payer: Self-pay | Admitting: Obstetrics and Gynecology

## 2021-10-18 ENCOUNTER — Ambulatory Visit (INDEPENDENT_AMBULATORY_CARE_PROVIDER_SITE_OTHER): Payer: BC Managed Care – PPO | Admitting: Obstetrics and Gynecology

## 2021-10-18 VITALS — BP 110/68 | HR 72 | Ht 64.5 in | Wt 148.0 lb

## 2021-10-18 DIAGNOSIS — N915 Oligomenorrhea, unspecified: Secondary | ICD-10-CM | POA: Diagnosis not present

## 2021-10-18 DIAGNOSIS — Z01419 Encounter for gynecological examination (general) (routine) without abnormal findings: Secondary | ICD-10-CM | POA: Diagnosis not present

## 2021-10-18 DIAGNOSIS — Z1211 Encounter for screening for malignant neoplasm of colon: Secondary | ICD-10-CM

## 2021-10-18 DIAGNOSIS — Z3009 Encounter for other general counseling and advice on contraception: Secondary | ICD-10-CM

## 2021-10-18 MED ORDER — NORETHINDRONE 0.35 MG PO TABS: 1.0000 | ORAL_TABLET | Freq: Every day | ORAL | 3 refills | Status: DC

## 2021-10-18 NOTE — Patient Instructions (Signed)

## 2021-10-19 LAB — FOLLICLE STIMULATING HORMONE: FSH: 9.8 m[IU]/mL

## 2021-10-19 LAB — PROLACTIN: Prolactin: 5.6 ng/mL

## 2022-08-15 ENCOUNTER — Other Ambulatory Visit: Payer: Self-pay | Admitting: Nurse Practitioner

## 2022-08-15 DIAGNOSIS — Z1231 Encounter for screening mammogram for malignant neoplasm of breast: Secondary | ICD-10-CM

## 2022-09-04 ENCOUNTER — Ambulatory Visit: Admission: RE | Admit: 2022-09-04 | Payer: BC Managed Care – PPO | Source: Ambulatory Visit

## 2022-09-04 DIAGNOSIS — Z1231 Encounter for screening mammogram for malignant neoplasm of breast: Secondary | ICD-10-CM

## 2022-10-22 ENCOUNTER — Ambulatory Visit: Payer: BC Managed Care – PPO | Admitting: Obstetrics and Gynecology

## 2022-10-22 ENCOUNTER — Encounter: Payer: Self-pay | Admitting: Nurse Practitioner

## 2022-10-22 ENCOUNTER — Ambulatory Visit (INDEPENDENT_AMBULATORY_CARE_PROVIDER_SITE_OTHER): Payer: BC Managed Care – PPO | Admitting: Nurse Practitioner

## 2022-10-22 VITALS — BP 122/78 | HR 68 | Ht 65.5 in | Wt 151.0 lb

## 2022-10-22 DIAGNOSIS — Z01419 Encounter for gynecological examination (general) (routine) without abnormal findings: Secondary | ICD-10-CM | POA: Diagnosis not present

## 2022-10-22 DIAGNOSIS — N915 Oligomenorrhea, unspecified: Secondary | ICD-10-CM

## 2022-10-22 DIAGNOSIS — Z3041 Encounter for surveillance of contraceptive pills: Secondary | ICD-10-CM

## 2022-10-22 MED ORDER — NORETHINDRONE 0.35 MG PO TABS: 1.0000 | ORAL_TABLET | Freq: Every day | ORAL | 3 refills | Status: AC

## 2022-10-22 NOTE — Progress Notes (Signed)
Amy Bonilla [birth year] [id number]   History:  47 y.o. G0 presents for annual exam. POPs. H/O irregular periods. Smoker. 2007 LGSIL, 2008 ASCUS +HPV, 2015 neg HPV HR+. 2008 thyroid cancer, managed by endo.   Gynecologic History No LMP recorded. (Menstrual status: Oral contraceptives).   Contraception/Family planning: oral progesterone-only contraceptive Sexually active: Yes  Health Maintenance Last Pap: 08/08/2020. Results were: Normal neg HPV, 5-year repeat Last mammogram: 09/04/2022. Results were: Normal Last colonoscopy: Never Last Dexa: Not indicated  Past medical history, past surgical history, family history and social history were all reviewed and documented in the EPIC chart. Boyfriend of 14 years. Middle school PE teacher.   ROS:  A ROS was performed and pertinent positives and negatives are included.  Exam:  Vitals:   10/22/22 1535  BP: 122/78  Pulse: 68  SpO2: 99%  Weight: 151 lb (68.5 kg)  Height: 5' 5.5" (1.664 m)   Body mass index is 24.75 kg/m.  General appearance:  Normal Thyroid:  Symmetrical, normal in size, without palpable masses or nodularity. Respiratory  Auscultation:  Clear without wheezing or rhonchi Cardiovascular  Auscultation:  Regular rate, without rubs, murmurs or gallops  Edema/varicosities:  Not grossly evident Abdominal  Soft,nontender, without masses, guarding or rebound.  Liver/spleen:  No organomegaly noted  Hernia:  None appreciated  Skin  Inspection:  Grossly normal Breasts: Examined lying and sitting.   Right: Without masses, retractions, nipple discharge or axillary adenopathy.   Left: Without masses, retractions, nipple discharge or axillary adenopathy. Pelvic: External genitalia:  no lesions              Urethra:  normal appearing urethra with no masses, tenderness or lesions              Bartholins and Skenes: normal                 Vagina: normal appearing vagina with normal color and discharge, no lesions               Cervix: no lesions Bimanual Exam:  Uterus:  no masses or tenderness              Adnexa: no mass, fullness, tenderness              Rectovaginal: Deferred              Anus:  normal, no lesions  Patient informed chaperone available to be present for breast and pelvic exam. Patient has requested no chaperone to be present. Patient has been advised what will be completed during breast and pelvic exam.   Assessment/Plan:  46 y.o. G0 for annual exam.   Well female exam with routine gynecological exam - Education provided on SBEs, importance of preventative screenings, current guidelines, high calcium diet, regular exercise, and multivitamin daily. Labs with PCP.   Oligomenorrhea, unspecified type - Plan: norethindrone (ORTHO MICRONOR) 0.35 MG tablet daily. Still irregular but better.   Encounter for surveillance of contraceptive pills - Plan: norethindrone (ORTHO MICRONOR) 0.35 MG tablet daily. Taking as prescribed. Refill x 1 year provided.   Screening for cervical cancer - Normal Pap history.  Will repeat at 5-year interval per guidelines.  Screening for breast cancer - Normal mammogram history.  Continue annual screenings.  Normal breast exam today.  Screening for colon cancer - Discussed current guidelines and importance of preventative screenings. Cologuard versus colonoscopy discussed. Wants to wait at this time.   Return in 1 year for annual or sooner if  needed.     Olivia Mackie DNP, 3:55 PM 10/22/2022

## 2023-02-24 IMAGING — MG MM DIGITAL SCREENING BILAT W/ TOMO AND CAD
8 series · 8 of 24 positions shown · non-contrast
Comparison: None.

CLINICAL DATA: Screening.

EXAM:
DIGITAL SCREENING BILATERAL MAMMOGRAM WITH TOMOSYNTHESIS AND CAD
TECHNIQUE: Bilateral screening digital craniocaudal and mediolateral oblique
mammograms were obtained. Bilateral screening digital breast
tomosynthesis was performed. The images were evaluated with
computer-aided detection.

[L MLO synth-2D]
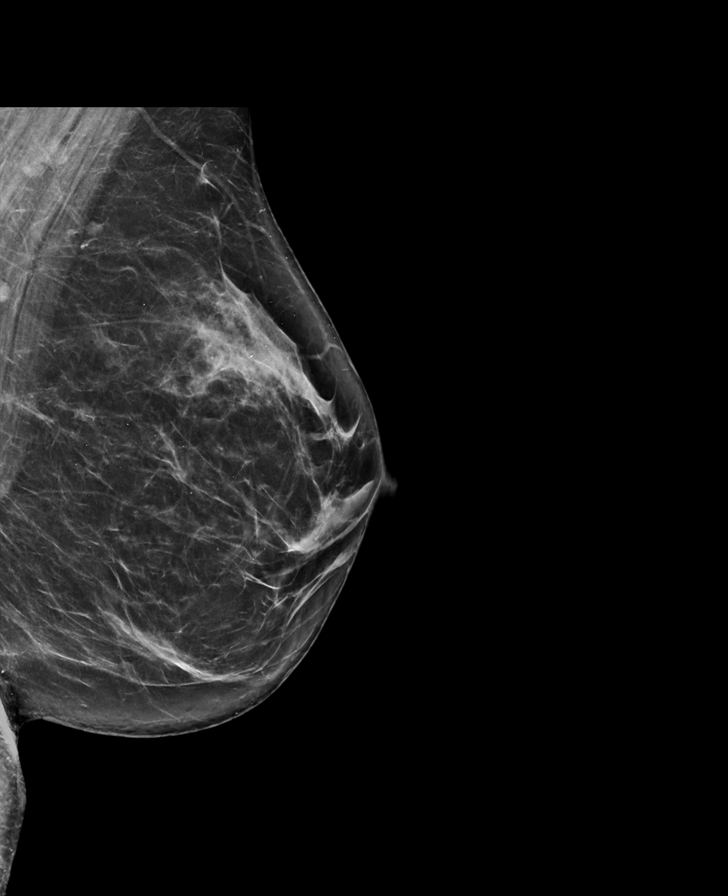

[L CC synth-2D]
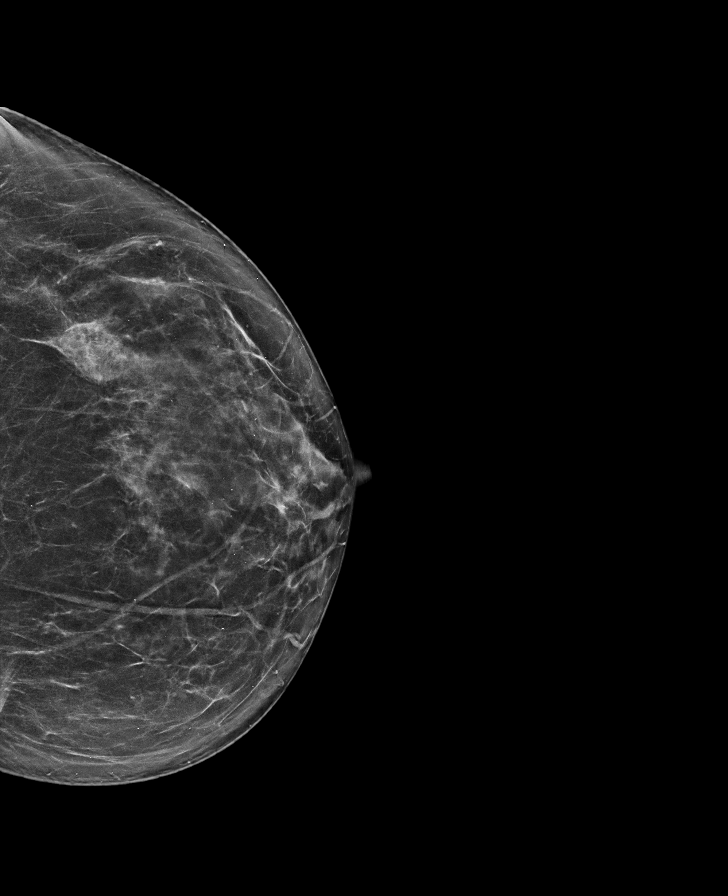

[R CC synth-2D]
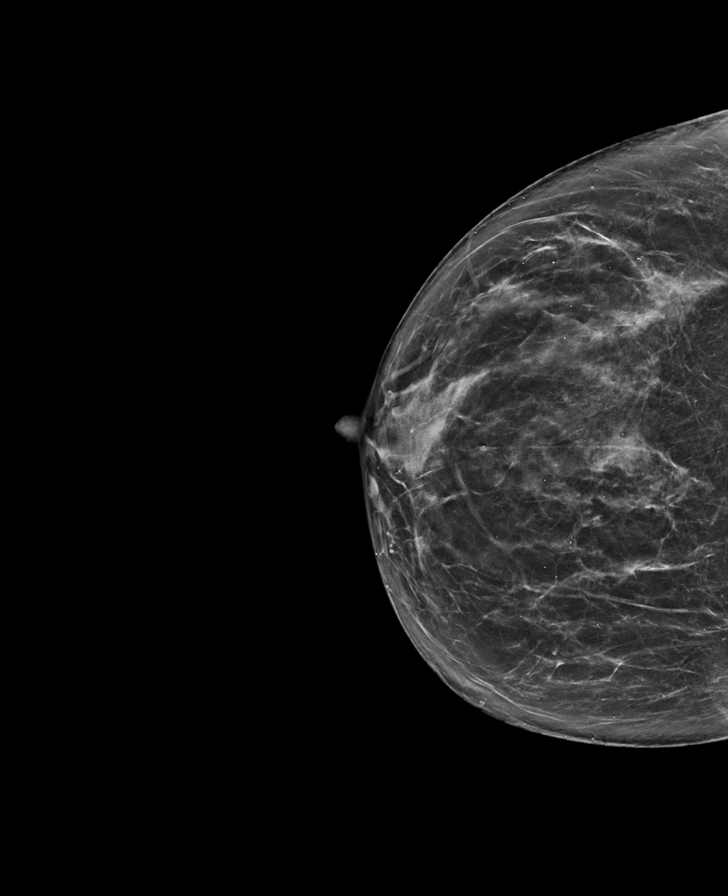

[R MLO synth-2D]
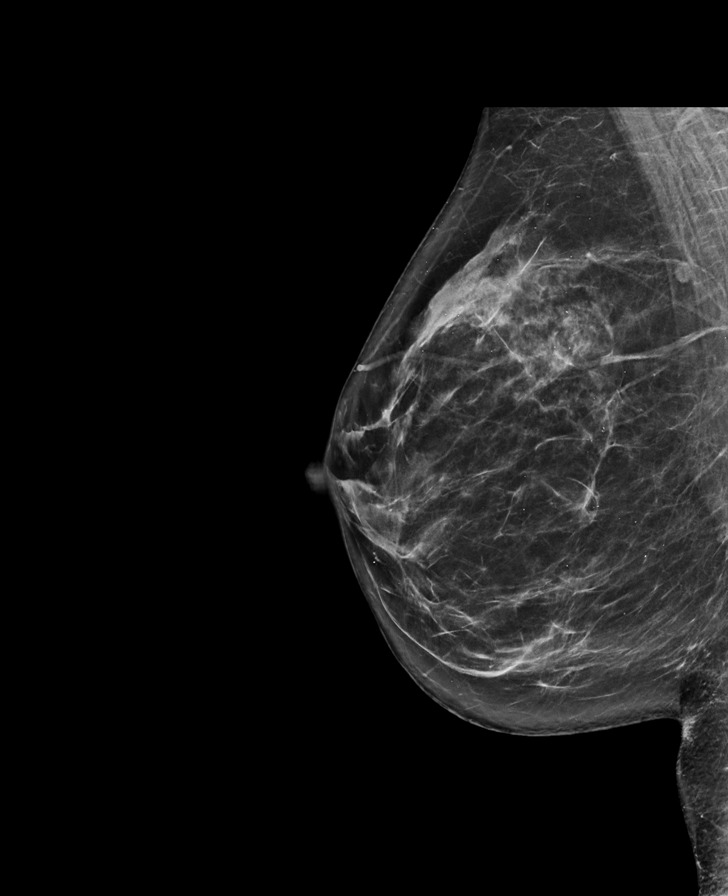

[R CC tomo · tomo slice 37/72.0]
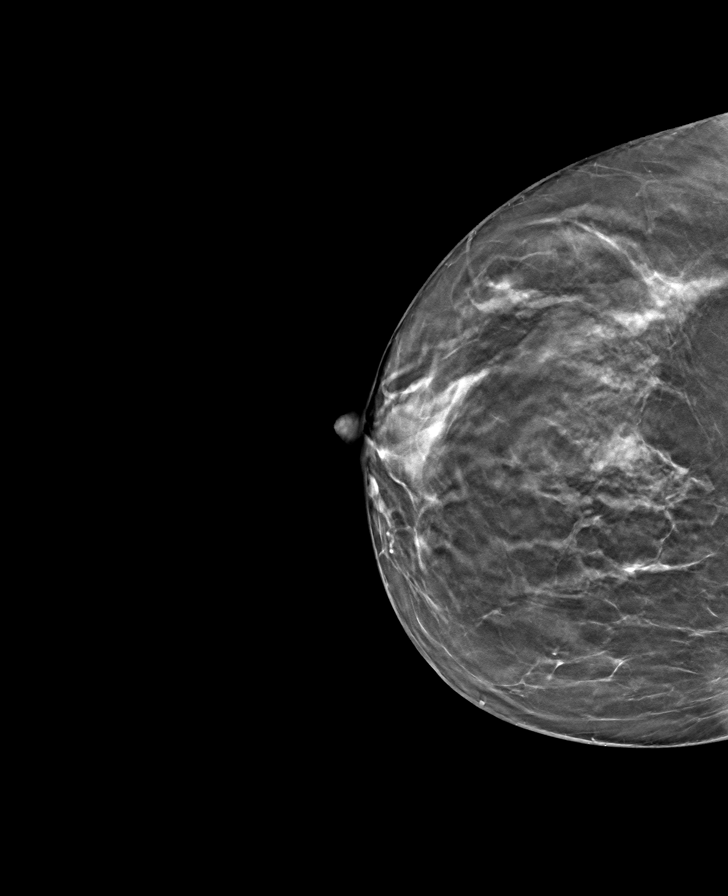

[L CC tomo · tomo slice 39/77.0]
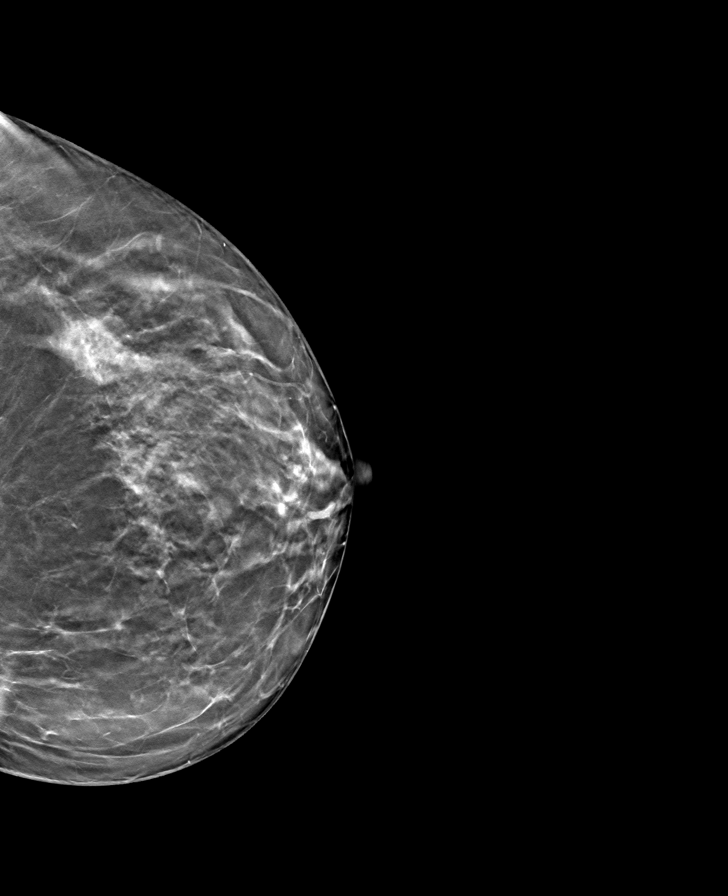

[L MLO tomo · tomo slice 40/79.0]
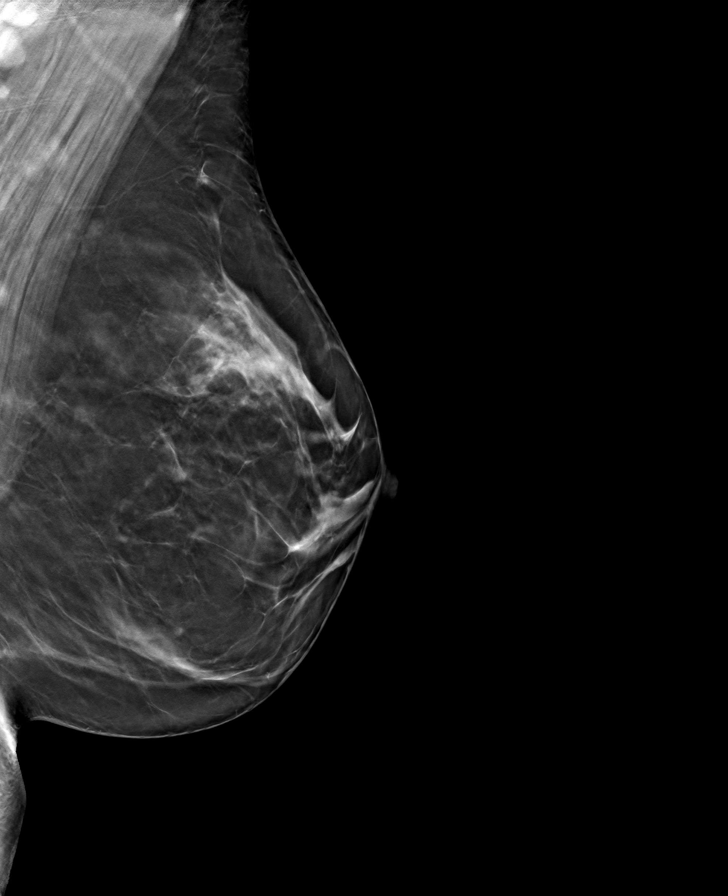

[R MLO tomo · tomo slice 39/77.0]
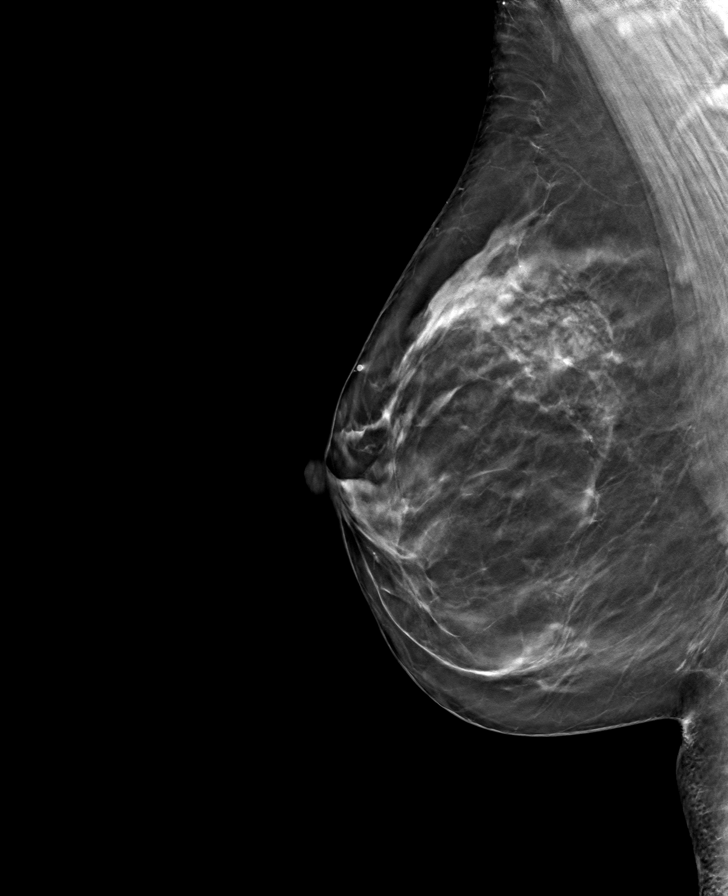

[8 of 24 positions shown; findings below may reference images not displayed]

ACR Breast Density Category b: There are scattered areas of
fibroglandular density.
FINDINGS: There are no findings suspicious for malignancy.
IMPRESSION: No mammographic evidence of malignancy. A result letter of this
screening mammogram will be mailed directly to the patient.

RECOMMENDATION:
Screening mammogram in one year. (Code:XG-X-X7B)

BI-RADS CATEGORY  1: Negative.

## 2023-08-20 ENCOUNTER — Other Ambulatory Visit: Payer: Self-pay | Admitting: Nurse Practitioner

## 2023-08-20 ENCOUNTER — Encounter: Payer: Self-pay | Admitting: Nurse Practitioner

## 2023-08-20 DIAGNOSIS — Z1231 Encounter for screening mammogram for malignant neoplasm of breast: Secondary | ICD-10-CM

## 2023-09-05 ENCOUNTER — Ambulatory Visit
Admission: RE | Admit: 2023-09-05 | Discharge: 2023-09-05 | Disposition: A | Discharge status: Home / Self Care | Payer: Self-pay | Source: Ambulatory Visit | Attending: Nurse Practitioner | Admitting: Nurse Practitioner

## 2023-09-05 DIAGNOSIS — Z1231 Encounter for screening mammogram for malignant neoplasm of breast: Secondary | ICD-10-CM

## 2023-10-24 ENCOUNTER — Encounter: Payer: Self-pay | Admitting: Nurse Practitioner

## 2023-10-24 ENCOUNTER — Ambulatory Visit: Payer: BC Managed Care – PPO | Admitting: Nurse Practitioner

## 2023-10-24 VITALS — BP 126/82 | HR 57 | Ht 65.0 in | Wt 156.0 lb

## 2023-10-24 DIAGNOSIS — Z1211 Encounter for screening for malignant neoplasm of colon: Secondary | ICD-10-CM

## 2023-10-24 DIAGNOSIS — N951 Menopausal and female climacteric states: Secondary | ICD-10-CM | POA: Diagnosis not present

## 2023-10-24 DIAGNOSIS — Z1331 Encounter for screening for depression: Secondary | ICD-10-CM

## 2023-10-24 DIAGNOSIS — Z01419 Encounter for gynecological examination (general) (routine) without abnormal findings: Secondary | ICD-10-CM | POA: Diagnosis not present

## 2023-10-24 NOTE — Progress Notes (Signed)
 Amy Bonilla Jun 06, 1975 [id number]   History:  48 y.o. G0 presents for annual exam. Complains of clear discharge without itching or odor. LMP in March. Stopped POPs months ago. Having some heat intolerance. TSH has been mildly low, managed by endo. 2008 thyroid cancer. Smoker. 2007 LGSIL, 2008 ASCUS +HPV, 2015 neg HPV HR+, neg biopsy.   Gynecologic History No LMP recorded.   Contraception/Family planning: oral progesterone-only contraceptive Sexually active: Yes  Health Maintenance Last Pap: 08/08/2020. Results were: Normal neg HPV Last mammogram: 09/05/2023. Results were: Normal Last colonoscopy: Never Last Dexa: Not indicated     10/24/2023    3:02 PM  Depression screen PHQ 2/9  Decreased Interest 0  Down, Depressed, Hopeless 0  PHQ - 2 Score 0     Past medical history, past surgical history, family history and social history were all reviewed and documented in the EPIC chart. Boyfriend of 15 years. [employer] teacher.   ROS:  A ROS was performed and pertinent positives and negatives are included.  Exam:  Vitals:   10/24/23 1458  BP: 126/82  Pulse: (!) 57  SpO2: 99%  Weight: 156 lb (70.8 kg)  Height: 5' 5 (1.651 m)    Body mass index is 25.96 kg/m.  General appearance:  Normal Thyroid:  Symmetrical, normal in size, without palpable masses or nodularity. Respiratory  Auscultation:  Clear without wheezing or rhonchi Cardiovascular  Auscultation:  Regular rate, without rubs, murmurs or gallops  Edema/varicosities:  Not grossly evident Abdominal  Soft,nontender, without masses, guarding or rebound.  Liver/spleen:  No organomegaly noted  Hernia:  None appreciated  Skin  Inspection:  Grossly normal Breasts: Examined lying and sitting.   Right: Without masses, retractions, nipple discharge or axillary adenopathy.   Left: Without masses, retractions, nipple discharge or axillary adenopathy. Pelvic: External genitalia:  no lesions               Urethra:  normal appearing urethra with no masses, tenderness or lesions              Bartholins and Skenes: normal                 Vagina: normal appearing vagina with normal color and discharge, no lesions              Cervix: no lesions Bimanual Exam:  Uterus:  no masses or tenderness              Adnexa: no mass, fullness, tenderness              Rectovaginal: Deferred              Anus:  normal, no lesions  Patient informed chaperone available to be present for breast and pelvic exam. Patient has requested no chaperone to be present. Patient has been advised what will be completed during breast and pelvic exam.   Assessment/Plan:  48 y.o. G0 for annual exam.   Well female exam with routine gynecological exam - Education provided on SBEs, importance of preventative screenings, current guidelines, high calcium diet, regular exercise, and multivitamin daily. Labs with PCP.   Perimenopausal - LMP in March. Discussed perimenopause and what to expect.   Screening for cervical cancer - Normal Pap history.  Will repeat at 5-year interval per guidelines.  Screening for breast cancer - Normal mammogram history.  Continue annual screenings.  Normal breast exam today.  Screening for colon cancer - Plan: Cologuard  Return in about 1 year (  around 10/23/2024) for Annual.     Amy DELENA Shutter DNP, 4:03 PM 10/24/2023
